# Patient Record
Sex: Male | Born: 1945
Health system: Southern US, Community
[De-identification: ages and names within clinical notes are randomized; demographics above are authoritative.]

## PROBLEM LIST (undated history)

## (undated) DIAGNOSIS — H3530 Unspecified macular degeneration: Secondary | ICD-10-CM

## (undated) DIAGNOSIS — E079 Disorder of thyroid, unspecified: Secondary | ICD-10-CM

## (undated) DIAGNOSIS — E119 Type 2 diabetes mellitus without complications: Secondary | ICD-10-CM

## (undated) DIAGNOSIS — M1990 Unspecified osteoarthritis, unspecified site: Secondary | ICD-10-CM

## (undated) DIAGNOSIS — I10 Essential (primary) hypertension: Secondary | ICD-10-CM

## (undated) DIAGNOSIS — E785 Hyperlipidemia, unspecified: Secondary | ICD-10-CM

## (undated) HISTORY — DX: Essential (primary) hypertension: I10

## (undated) HISTORY — DX: Hyperlipidemia, unspecified: E78.5

## (undated) HISTORY — DX: Disorder of thyroid, unspecified: E07.9

## (undated) HISTORY — DX: Unspecified osteoarthritis, unspecified site: M19.90

## (undated) HISTORY — DX: Type 2 diabetes mellitus without complications: E11.9

## (undated) HISTORY — DX: Unspecified macular degeneration: H35.30

---

## 1993-03-12 DIAGNOSIS — E119 Type 2 diabetes mellitus without complications: Secondary | ICD-10-CM

## 1993-03-12 HISTORY — DX: Type 2 diabetes mellitus without complications: E11.9

## 2015-03-25 DIAGNOSIS — J069 Acute upper respiratory infection, unspecified: Secondary | ICD-10-CM | POA: Diagnosis not present

## 2015-03-25 DIAGNOSIS — Z23 Encounter for immunization: Secondary | ICD-10-CM | POA: Diagnosis not present

## 2015-03-25 DIAGNOSIS — Z7984 Long term (current) use of oral hypoglycemic drugs: Secondary | ICD-10-CM | POA: Diagnosis not present

## 2015-03-25 DIAGNOSIS — E785 Hyperlipidemia, unspecified: Secondary | ICD-10-CM | POA: Diagnosis not present

## 2015-03-25 DIAGNOSIS — E119 Type 2 diabetes mellitus without complications: Secondary | ICD-10-CM | POA: Diagnosis not present

## 2015-03-31 ENCOUNTER — Ambulatory Visit: Payer: Self-pay | Admitting: Family

## 2015-05-10 DIAGNOSIS — H01005 Unspecified blepharitis left lower eyelid: Secondary | ICD-10-CM | POA: Diagnosis not present

## 2015-05-10 DIAGNOSIS — H01002 Unspecified blepharitis right lower eyelid: Secondary | ICD-10-CM | POA: Diagnosis not present

## 2015-05-10 DIAGNOSIS — H01001 Unspecified blepharitis right upper eyelid: Secondary | ICD-10-CM | POA: Diagnosis not present

## 2015-05-10 DIAGNOSIS — H01004 Unspecified blepharitis left upper eyelid: Secondary | ICD-10-CM | POA: Diagnosis not present

## 2015-05-10 DIAGNOSIS — H5213 Myopia, bilateral: Secondary | ICD-10-CM | POA: Diagnosis not present

## 2015-05-10 DIAGNOSIS — H52223 Regular astigmatism, bilateral: Secondary | ICD-10-CM | POA: Diagnosis not present

## 2015-05-10 DIAGNOSIS — H524 Presbyopia: Secondary | ICD-10-CM | POA: Diagnosis not present

## 2015-05-10 DIAGNOSIS — E119 Type 2 diabetes mellitus without complications: Secondary | ICD-10-CM | POA: Diagnosis not present

## 2015-05-10 DIAGNOSIS — H2513 Age-related nuclear cataract, bilateral: Secondary | ICD-10-CM | POA: Diagnosis not present

## 2015-10-11 DIAGNOSIS — Z7984 Long term (current) use of oral hypoglycemic drugs: Secondary | ICD-10-CM | POA: Diagnosis not present

## 2015-10-11 DIAGNOSIS — E119 Type 2 diabetes mellitus without complications: Secondary | ICD-10-CM | POA: Diagnosis not present

## 2015-10-11 DIAGNOSIS — B353 Tinea pedis: Secondary | ICD-10-CM | POA: Diagnosis not present

## 2015-10-11 DIAGNOSIS — E785 Hyperlipidemia, unspecified: Secondary | ICD-10-CM | POA: Diagnosis not present

## 2015-10-11 DIAGNOSIS — E1165 Type 2 diabetes mellitus with hyperglycemia: Secondary | ICD-10-CM | POA: Diagnosis not present

## 2015-10-11 DIAGNOSIS — Z79899 Other long term (current) drug therapy: Secondary | ICD-10-CM | POA: Diagnosis not present

## 2015-10-11 DIAGNOSIS — E7800 Pure hypercholesterolemia, unspecified: Secondary | ICD-10-CM | POA: Diagnosis not present

## 2016-01-06 DIAGNOSIS — Z7984 Long term (current) use of oral hypoglycemic drugs: Secondary | ICD-10-CM | POA: Diagnosis not present

## 2016-01-06 DIAGNOSIS — E119 Type 2 diabetes mellitus without complications: Secondary | ICD-10-CM | POA: Diagnosis not present

## 2016-01-06 DIAGNOSIS — R55 Syncope and collapse: Secondary | ICD-10-CM | POA: Diagnosis not present

## 2016-01-06 DIAGNOSIS — E7800 Pure hypercholesterolemia, unspecified: Secondary | ICD-10-CM | POA: Diagnosis not present

## 2016-01-06 DIAGNOSIS — E785 Hyperlipidemia, unspecified: Secondary | ICD-10-CM | POA: Diagnosis not present

## 2016-01-20 DIAGNOSIS — E119 Type 2 diabetes mellitus without complications: Secondary | ICD-10-CM | POA: Diagnosis not present

## 2016-01-20 DIAGNOSIS — Z0000 Encounter for general adult medical examination without abnormal findings: Secondary | ICD-10-CM | POA: Diagnosis not present

## 2016-01-20 DIAGNOSIS — E1165 Type 2 diabetes mellitus with hyperglycemia: Secondary | ICD-10-CM | POA: Diagnosis not present

## 2016-01-20 DIAGNOSIS — Z7984 Long term (current) use of oral hypoglycemic drugs: Secondary | ICD-10-CM | POA: Diagnosis not present

## 2016-01-20 DIAGNOSIS — Z79899 Other long term (current) drug therapy: Secondary | ICD-10-CM | POA: Diagnosis not present

## 2016-01-20 DIAGNOSIS — Z125 Encounter for screening for malignant neoplasm of prostate: Secondary | ICD-10-CM | POA: Diagnosis not present

## 2016-01-20 DIAGNOSIS — E7800 Pure hypercholesterolemia, unspecified: Secondary | ICD-10-CM | POA: Diagnosis not present

## 2016-01-20 DIAGNOSIS — B353 Tinea pedis: Secondary | ICD-10-CM | POA: Diagnosis not present

## 2016-01-27 DIAGNOSIS — Z1211 Encounter for screening for malignant neoplasm of colon: Secondary | ICD-10-CM | POA: Diagnosis not present

## 2016-01-27 DIAGNOSIS — Z79899 Other long term (current) drug therapy: Secondary | ICD-10-CM | POA: Diagnosis not present

## 2016-01-27 DIAGNOSIS — Z0000 Encounter for general adult medical examination without abnormal findings: Secondary | ICD-10-CM | POA: Diagnosis not present

## 2016-03-21 DIAGNOSIS — J209 Acute bronchitis, unspecified: Secondary | ICD-10-CM | POA: Diagnosis not present

## 2016-05-11 DIAGNOSIS — H52223 Regular astigmatism, bilateral: Secondary | ICD-10-CM | POA: Diagnosis not present

## 2016-05-11 DIAGNOSIS — H01001 Unspecified blepharitis right upper eyelid: Secondary | ICD-10-CM | POA: Diagnosis not present

## 2016-05-11 DIAGNOSIS — H5213 Myopia, bilateral: Secondary | ICD-10-CM | POA: Diagnosis not present

## 2016-05-11 DIAGNOSIS — H01004 Unspecified blepharitis left upper eyelid: Secondary | ICD-10-CM | POA: Diagnosis not present

## 2016-05-11 DIAGNOSIS — H524 Presbyopia: Secondary | ICD-10-CM | POA: Diagnosis not present

## 2016-05-11 DIAGNOSIS — H2513 Age-related nuclear cataract, bilateral: Secondary | ICD-10-CM | POA: Diagnosis not present

## 2016-05-11 DIAGNOSIS — E119 Type 2 diabetes mellitus without complications: Secondary | ICD-10-CM | POA: Diagnosis not present

## 2016-07-20 DIAGNOSIS — B351 Tinea unguium: Secondary | ICD-10-CM | POA: Diagnosis not present

## 2016-07-20 DIAGNOSIS — E119 Type 2 diabetes mellitus without complications: Secondary | ICD-10-CM | POA: Diagnosis not present

## 2016-07-20 DIAGNOSIS — Z79899 Other long term (current) drug therapy: Secondary | ICD-10-CM | POA: Diagnosis not present

## 2016-07-20 DIAGNOSIS — R0989 Other specified symptoms and signs involving the circulatory and respiratory systems: Secondary | ICD-10-CM | POA: Diagnosis not present

## 2016-07-20 DIAGNOSIS — E7800 Pure hypercholesterolemia, unspecified: Secondary | ICD-10-CM | POA: Diagnosis not present

## 2016-07-23 ENCOUNTER — Other Ambulatory Visit: Payer: Self-pay | Admitting: Family Medicine

## 2016-07-23 DIAGNOSIS — R0989 Other specified symptoms and signs involving the circulatory and respiratory systems: Secondary | ICD-10-CM

## 2016-08-10 ENCOUNTER — Ambulatory Visit
Admission: RE | Admit: 2016-08-10 | Discharge: 2016-08-10 | Disposition: A | Discharge status: Home / Self Care | Payer: Self-pay | Source: Ambulatory Visit | Attending: Family Medicine | Admitting: Family Medicine

## 2016-08-10 DIAGNOSIS — E119 Type 2 diabetes mellitus without complications: Secondary | ICD-10-CM | POA: Diagnosis not present

## 2016-08-10 DIAGNOSIS — R0989 Other specified symptoms and signs involving the circulatory and respiratory systems: Secondary | ICD-10-CM

## 2016-08-17 DIAGNOSIS — B351 Tinea unguium: Secondary | ICD-10-CM | POA: Diagnosis not present

## 2016-10-22 DIAGNOSIS — E119 Type 2 diabetes mellitus without complications: Secondary | ICD-10-CM | POA: Diagnosis not present

## 2016-10-29 DIAGNOSIS — B351 Tinea unguium: Secondary | ICD-10-CM | POA: Diagnosis not present

## 2016-10-29 DIAGNOSIS — E119 Type 2 diabetes mellitus without complications: Secondary | ICD-10-CM | POA: Diagnosis not present

## 2016-12-27 DIAGNOSIS — R42 Dizziness and giddiness: Secondary | ICD-10-CM | POA: Diagnosis not present

## 2016-12-27 DIAGNOSIS — R208 Other disturbances of skin sensation: Secondary | ICD-10-CM | POA: Diagnosis not present

## 2016-12-27 DIAGNOSIS — E119 Type 2 diabetes mellitus without complications: Secondary | ICD-10-CM | POA: Diagnosis not present

## 2016-12-27 DIAGNOSIS — R202 Paresthesia of skin: Secondary | ICD-10-CM | POA: Diagnosis not present

## 2017-01-03 DIAGNOSIS — Z23 Encounter for immunization: Secondary | ICD-10-CM | POA: Diagnosis not present

## 2017-02-20 DIAGNOSIS — Z0000 Encounter for general adult medical examination without abnormal findings: Secondary | ICD-10-CM | POA: Diagnosis not present

## 2017-02-20 DIAGNOSIS — E119 Type 2 diabetes mellitus without complications: Secondary | ICD-10-CM | POA: Diagnosis not present

## 2017-02-20 DIAGNOSIS — E7800 Pure hypercholesterolemia, unspecified: Secondary | ICD-10-CM | POA: Diagnosis not present

## 2017-02-20 DIAGNOSIS — Z79899 Other long term (current) drug therapy: Secondary | ICD-10-CM | POA: Diagnosis not present

## 2017-05-17 DIAGNOSIS — H524 Presbyopia: Secondary | ICD-10-CM | POA: Diagnosis not present

## 2017-05-17 DIAGNOSIS — H01001 Unspecified blepharitis right upper eyelid: Secondary | ICD-10-CM | POA: Diagnosis not present

## 2017-05-17 DIAGNOSIS — H01004 Unspecified blepharitis left upper eyelid: Secondary | ICD-10-CM | POA: Diagnosis not present

## 2017-05-17 DIAGNOSIS — E119 Type 2 diabetes mellitus without complications: Secondary | ICD-10-CM | POA: Diagnosis not present

## 2017-05-17 DIAGNOSIS — H2513 Age-related nuclear cataract, bilateral: Secondary | ICD-10-CM | POA: Diagnosis not present

## 2017-05-17 DIAGNOSIS — H5211 Myopia, right eye: Secondary | ICD-10-CM | POA: Diagnosis not present

## 2017-05-30 DIAGNOSIS — K228 Other specified diseases of esophagus: Secondary | ICD-10-CM | POA: Diagnosis not present

## 2017-05-30 DIAGNOSIS — D126 Benign neoplasm of colon, unspecified: Secondary | ICD-10-CM | POA: Diagnosis not present

## 2017-05-30 DIAGNOSIS — K269 Duodenal ulcer, unspecified as acute or chronic, without hemorrhage or perforation: Secondary | ICD-10-CM | POA: Diagnosis not present

## 2017-05-30 DIAGNOSIS — K2930 Chronic superficial gastritis without bleeding: Secondary | ICD-10-CM | POA: Diagnosis not present

## 2017-05-30 DIAGNOSIS — K219 Gastro-esophageal reflux disease without esophagitis: Secondary | ICD-10-CM | POA: Diagnosis not present

## 2017-05-30 DIAGNOSIS — Z1211 Encounter for screening for malignant neoplasm of colon: Secondary | ICD-10-CM | POA: Diagnosis not present

## 2017-05-30 DIAGNOSIS — K2270 Barrett's esophagus without dysplasia: Secondary | ICD-10-CM | POA: Diagnosis not present

## 2017-05-30 DIAGNOSIS — K635 Polyp of colon: Secondary | ICD-10-CM | POA: Diagnosis not present

## 2017-06-06 DIAGNOSIS — K2930 Chronic superficial gastritis without bleeding: Secondary | ICD-10-CM | POA: Diagnosis not present

## 2017-06-06 DIAGNOSIS — D126 Benign neoplasm of colon, unspecified: Secondary | ICD-10-CM | POA: Diagnosis not present

## 2017-06-06 DIAGNOSIS — K219 Gastro-esophageal reflux disease without esophagitis: Secondary | ICD-10-CM | POA: Diagnosis not present

## 2017-06-06 DIAGNOSIS — Z1211 Encounter for screening for malignant neoplasm of colon: Secondary | ICD-10-CM | POA: Diagnosis not present

## 2017-08-26 DIAGNOSIS — E119 Type 2 diabetes mellitus without complications: Secondary | ICD-10-CM | POA: Diagnosis not present

## 2017-08-26 DIAGNOSIS — E7800 Pure hypercholesterolemia, unspecified: Secondary | ICD-10-CM | POA: Diagnosis not present

## 2017-08-26 DIAGNOSIS — E1165 Type 2 diabetes mellitus with hyperglycemia: Secondary | ICD-10-CM | POA: Diagnosis not present

## 2017-08-26 DIAGNOSIS — Z79899 Other long term (current) drug therapy: Secondary | ICD-10-CM | POA: Diagnosis not present

## 2017-12-10 DIAGNOSIS — E7800 Pure hypercholesterolemia, unspecified: Secondary | ICD-10-CM | POA: Diagnosis not present

## 2017-12-10 DIAGNOSIS — Z79899 Other long term (current) drug therapy: Secondary | ICD-10-CM | POA: Diagnosis not present

## 2017-12-10 DIAGNOSIS — Z23 Encounter for immunization: Secondary | ICD-10-CM | POA: Diagnosis not present

## 2017-12-10 DIAGNOSIS — E119 Type 2 diabetes mellitus without complications: Secondary | ICD-10-CM | POA: Diagnosis not present

## 2018-05-05 DIAGNOSIS — Z79899 Other long term (current) drug therapy: Secondary | ICD-10-CM | POA: Diagnosis not present

## 2018-05-05 DIAGNOSIS — E119 Type 2 diabetes mellitus without complications: Secondary | ICD-10-CM | POA: Diagnosis not present

## 2018-05-05 DIAGNOSIS — E559 Vitamin D deficiency, unspecified: Secondary | ICD-10-CM | POA: Diagnosis not present

## 2018-05-05 DIAGNOSIS — E7800 Pure hypercholesterolemia, unspecified: Secondary | ICD-10-CM | POA: Diagnosis not present

## 2018-05-05 DIAGNOSIS — I10 Essential (primary) hypertension: Secondary | ICD-10-CM | POA: Diagnosis not present

## 2018-05-05 DIAGNOSIS — R202 Paresthesia of skin: Secondary | ICD-10-CM | POA: Diagnosis not present

## 2018-05-05 DIAGNOSIS — Z7984 Long term (current) use of oral hypoglycemic drugs: Secondary | ICD-10-CM | POA: Diagnosis not present

## 2018-05-13 DIAGNOSIS — H01001 Unspecified blepharitis right upper eyelid: Secondary | ICD-10-CM | POA: Diagnosis not present

## 2018-05-13 DIAGNOSIS — H01004 Unspecified blepharitis left upper eyelid: Secondary | ICD-10-CM | POA: Diagnosis not present

## 2018-05-13 DIAGNOSIS — H34832 Tributary (branch) retinal vein occlusion, left eye, with macular edema: Secondary | ICD-10-CM | POA: Diagnosis not present

## 2018-05-13 DIAGNOSIS — H2513 Age-related nuclear cataract, bilateral: Secondary | ICD-10-CM | POA: Diagnosis not present

## 2018-05-13 DIAGNOSIS — E119 Type 2 diabetes mellitus without complications: Secondary | ICD-10-CM | POA: Diagnosis not present

## 2018-05-19 DIAGNOSIS — H53132 Sudden visual loss, left eye: Secondary | ICD-10-CM | POA: Diagnosis not present

## 2018-05-19 DIAGNOSIS — H3562 Retinal hemorrhage, left eye: Secondary | ICD-10-CM | POA: Diagnosis not present

## 2018-05-19 DIAGNOSIS — H34832 Tributary (branch) retinal vein occlusion, left eye, with macular edema: Secondary | ICD-10-CM | POA: Diagnosis not present

## 2018-05-19 DIAGNOSIS — H35033 Hypertensive retinopathy, bilateral: Secondary | ICD-10-CM | POA: Diagnosis not present

## 2018-05-19 DIAGNOSIS — H25813 Combined forms of age-related cataract, bilateral: Secondary | ICD-10-CM | POA: Diagnosis not present

## 2018-05-22 DIAGNOSIS — H34832 Tributary (branch) retinal vein occlusion, left eye, with macular edema: Secondary | ICD-10-CM | POA: Diagnosis not present

## 2018-05-26 DIAGNOSIS — Z79899 Other long term (current) drug therapy: Secondary | ICD-10-CM | POA: Diagnosis not present

## 2018-05-26 DIAGNOSIS — E7800 Pure hypercholesterolemia, unspecified: Secondary | ICD-10-CM | POA: Diagnosis not present

## 2018-05-26 DIAGNOSIS — E119 Type 2 diabetes mellitus without complications: Secondary | ICD-10-CM | POA: Diagnosis not present

## 2018-05-26 DIAGNOSIS — I10 Essential (primary) hypertension: Secondary | ICD-10-CM | POA: Diagnosis not present

## 2018-07-04 DIAGNOSIS — H34832 Tributary (branch) retinal vein occlusion, left eye, with macular edema: Secondary | ICD-10-CM | POA: Diagnosis not present

## 2018-08-06 DIAGNOSIS — E119 Type 2 diabetes mellitus without complications: Secondary | ICD-10-CM | POA: Diagnosis not present

## 2018-08-06 DIAGNOSIS — Z79899 Other long term (current) drug therapy: Secondary | ICD-10-CM | POA: Diagnosis not present

## 2018-08-07 DIAGNOSIS — H34832 Tributary (branch) retinal vein occlusion, left eye, with macular edema: Secondary | ICD-10-CM | POA: Diagnosis not present

## 2018-08-08 DIAGNOSIS — E119 Type 2 diabetes mellitus without complications: Secondary | ICD-10-CM | POA: Diagnosis not present

## 2018-08-08 DIAGNOSIS — Z7984 Long term (current) use of oral hypoglycemic drugs: Secondary | ICD-10-CM | POA: Diagnosis not present

## 2018-08-08 DIAGNOSIS — E7800 Pure hypercholesterolemia, unspecified: Secondary | ICD-10-CM | POA: Diagnosis not present

## 2018-08-08 DIAGNOSIS — I10 Essential (primary) hypertension: Secondary | ICD-10-CM | POA: Diagnosis not present

## 2018-09-15 DIAGNOSIS — H34832 Tributary (branch) retinal vein occlusion, left eye, with macular edema: Secondary | ICD-10-CM | POA: Diagnosis not present

## 2018-10-22 DIAGNOSIS — H34832 Tributary (branch) retinal vein occlusion, left eye, with macular edema: Secondary | ICD-10-CM | POA: Diagnosis not present

## 2018-11-18 DIAGNOSIS — Z79899 Other long term (current) drug therapy: Secondary | ICD-10-CM | POA: Diagnosis not present

## 2018-11-18 DIAGNOSIS — E559 Vitamin D deficiency, unspecified: Secondary | ICD-10-CM | POA: Diagnosis not present

## 2018-11-18 DIAGNOSIS — I10 Essential (primary) hypertension: Secondary | ICD-10-CM | POA: Diagnosis not present

## 2018-11-18 DIAGNOSIS — E7800 Pure hypercholesterolemia, unspecified: Secondary | ICD-10-CM | POA: Diagnosis not present

## 2018-11-18 DIAGNOSIS — Z7984 Long term (current) use of oral hypoglycemic drugs: Secondary | ICD-10-CM | POA: Diagnosis not present

## 2018-11-18 DIAGNOSIS — R0989 Other specified symptoms and signs involving the circulatory and respiratory systems: Secondary | ICD-10-CM | POA: Diagnosis not present

## 2018-11-18 DIAGNOSIS — Z1211 Encounter for screening for malignant neoplasm of colon: Secondary | ICD-10-CM | POA: Diagnosis not present

## 2018-11-18 DIAGNOSIS — E119 Type 2 diabetes mellitus without complications: Secondary | ICD-10-CM | POA: Diagnosis not present

## 2018-11-21 ENCOUNTER — Other Ambulatory Visit: Payer: Self-pay | Admitting: Family Medicine

## 2018-11-21 DIAGNOSIS — R0989 Other specified symptoms and signs involving the circulatory and respiratory systems: Secondary | ICD-10-CM

## 2018-11-28 ENCOUNTER — Ambulatory Visit
Admission: RE | Admit: 2018-11-28 | Discharge: 2018-11-28 | Disposition: A | Discharge status: Home / Self Care | Payer: Medicare HMO | Source: Ambulatory Visit | Attending: Family Medicine | Admitting: Family Medicine

## 2018-11-28 DIAGNOSIS — R0989 Other specified symptoms and signs involving the circulatory and respiratory systems: Secondary | ICD-10-CM | POA: Diagnosis not present

## 2018-12-03 DIAGNOSIS — Z1211 Encounter for screening for malignant neoplasm of colon: Secondary | ICD-10-CM | POA: Diagnosis not present

## 2018-12-03 DIAGNOSIS — H34832 Tributary (branch) retinal vein occlusion, left eye, with macular edema: Secondary | ICD-10-CM | POA: Diagnosis not present

## 2019-01-07 DIAGNOSIS — H34832 Tributary (branch) retinal vein occlusion, left eye, with macular edema: Secondary | ICD-10-CM | POA: Diagnosis not present

## 2019-02-16 DIAGNOSIS — H34832 Tributary (branch) retinal vein occlusion, left eye, with macular edema: Secondary | ICD-10-CM | POA: Diagnosis not present

## 2019-03-13 DIAGNOSIS — E079 Disorder of thyroid, unspecified: Secondary | ICD-10-CM

## 2019-03-13 HISTORY — DX: Disorder of thyroid, unspecified: E07.9

## 2019-03-25 DIAGNOSIS — H34832 Tributary (branch) retinal vein occlusion, left eye, with macular edema: Secondary | ICD-10-CM | POA: Diagnosis not present

## 2019-04-28 ENCOUNTER — Telehealth: Payer: Self-pay

## 2019-04-28 NOTE — Telephone Encounter (Signed)
Pt called to ask to take both him and his wife off covid vaccine waiting list.

## 2019-05-06 DIAGNOSIS — H34832 Tributary (branch) retinal vein occlusion, left eye, with macular edema: Secondary | ICD-10-CM | POA: Diagnosis not present

## 2019-05-19 DIAGNOSIS — H2513 Age-related nuclear cataract, bilateral: Secondary | ICD-10-CM | POA: Diagnosis not present

## 2019-05-19 DIAGNOSIS — E119 Type 2 diabetes mellitus without complications: Secondary | ICD-10-CM | POA: Diagnosis not present

## 2019-05-19 DIAGNOSIS — H34832 Tributary (branch) retinal vein occlusion, left eye, with macular edema: Secondary | ICD-10-CM | POA: Diagnosis not present

## 2019-05-19 DIAGNOSIS — H0100A Unspecified blepharitis right eye, upper and lower eyelids: Secondary | ICD-10-CM | POA: Diagnosis not present

## 2019-05-29 DIAGNOSIS — D649 Anemia, unspecified: Secondary | ICD-10-CM | POA: Diagnosis not present

## 2019-05-29 DIAGNOSIS — E559 Vitamin D deficiency, unspecified: Secondary | ICD-10-CM | POA: Diagnosis not present

## 2019-05-29 DIAGNOSIS — Z79899 Other long term (current) drug therapy: Secondary | ICD-10-CM | POA: Diagnosis not present

## 2019-05-29 DIAGNOSIS — E7800 Pure hypercholesterolemia, unspecified: Secondary | ICD-10-CM | POA: Diagnosis not present

## 2019-05-29 DIAGNOSIS — E119 Type 2 diabetes mellitus without complications: Secondary | ICD-10-CM | POA: Diagnosis not present

## 2019-06-01 DIAGNOSIS — I10 Essential (primary) hypertension: Secondary | ICD-10-CM | POA: Diagnosis not present

## 2019-06-01 DIAGNOSIS — R946 Abnormal results of thyroid function studies: Secondary | ICD-10-CM | POA: Diagnosis not present

## 2019-06-01 DIAGNOSIS — E7800 Pure hypercholesterolemia, unspecified: Secondary | ICD-10-CM | POA: Diagnosis not present

## 2019-06-01 DIAGNOSIS — E119 Type 2 diabetes mellitus without complications: Secondary | ICD-10-CM | POA: Diagnosis not present

## 2019-06-01 DIAGNOSIS — D649 Anemia, unspecified: Secondary | ICD-10-CM | POA: Diagnosis not present

## 2019-06-01 DIAGNOSIS — E039 Hypothyroidism, unspecified: Secondary | ICD-10-CM | POA: Diagnosis not present

## 2019-06-10 DIAGNOSIS — H3562 Retinal hemorrhage, left eye: Secondary | ICD-10-CM | POA: Diagnosis not present

## 2019-06-10 DIAGNOSIS — H25813 Combined forms of age-related cataract, bilateral: Secondary | ICD-10-CM | POA: Diagnosis not present

## 2019-06-10 DIAGNOSIS — H34832 Tributary (branch) retinal vein occlusion, left eye, with macular edema: Secondary | ICD-10-CM | POA: Diagnosis not present

## 2019-06-10 DIAGNOSIS — H35033 Hypertensive retinopathy, bilateral: Secondary | ICD-10-CM | POA: Diagnosis not present

## 2019-07-09 DIAGNOSIS — H34832 Tributary (branch) retinal vein occlusion, left eye, with macular edema: Secondary | ICD-10-CM | POA: Diagnosis not present

## 2019-07-27 DIAGNOSIS — D649 Anemia, unspecified: Secondary | ICD-10-CM | POA: Diagnosis not present

## 2019-07-27 DIAGNOSIS — Z130 Encounter for screening for diseases of the blood and blood-forming organs and certain disorders involving the immune mechanism: Secondary | ICD-10-CM | POA: Diagnosis not present

## 2019-07-27 DIAGNOSIS — R946 Abnormal results of thyroid function studies: Secondary | ICD-10-CM | POA: Diagnosis not present

## 2019-09-09 DIAGNOSIS — H34832 Tributary (branch) retinal vein occlusion, left eye, with macular edema: Secondary | ICD-10-CM | POA: Diagnosis not present

## 2019-10-06 DIAGNOSIS — E039 Hypothyroidism, unspecified: Secondary | ICD-10-CM | POA: Diagnosis not present

## 2019-12-02 DIAGNOSIS — H34832 Tributary (branch) retinal vein occlusion, left eye, with macular edema: Secondary | ICD-10-CM | POA: Diagnosis not present

## 2019-12-08 DIAGNOSIS — E1169 Type 2 diabetes mellitus with other specified complication: Secondary | ICD-10-CM | POA: Diagnosis not present

## 2019-12-08 DIAGNOSIS — Z0001 Encounter for general adult medical examination with abnormal findings: Secondary | ICD-10-CM | POA: Diagnosis not present

## 2019-12-08 DIAGNOSIS — E7800 Pure hypercholesterolemia, unspecified: Secondary | ICD-10-CM | POA: Diagnosis not present

## 2019-12-08 DIAGNOSIS — Z125 Encounter for screening for malignant neoplasm of prostate: Secondary | ICD-10-CM | POA: Diagnosis not present

## 2019-12-08 DIAGNOSIS — D649 Anemia, unspecified: Secondary | ICD-10-CM | POA: Diagnosis not present

## 2019-12-08 DIAGNOSIS — E039 Hypothyroidism, unspecified: Secondary | ICD-10-CM | POA: Diagnosis not present

## 2019-12-08 DIAGNOSIS — E559 Vitamin D deficiency, unspecified: Secondary | ICD-10-CM | POA: Diagnosis not present

## 2019-12-08 DIAGNOSIS — Z79899 Other long term (current) drug therapy: Secondary | ICD-10-CM | POA: Diagnosis not present

## 2019-12-08 DIAGNOSIS — I10 Essential (primary) hypertension: Secondary | ICD-10-CM | POA: Diagnosis not present

## 2020-03-07 DIAGNOSIS — Z20822 Contact with and (suspected) exposure to covid-19: Secondary | ICD-10-CM | POA: Diagnosis not present

## 2020-03-08 DIAGNOSIS — H34832 Tributary (branch) retinal vein occlusion, left eye, with macular edema: Secondary | ICD-10-CM | POA: Diagnosis not present

## 2020-03-16 DIAGNOSIS — E039 Hypothyroidism, unspecified: Secondary | ICD-10-CM | POA: Diagnosis not present

## 2020-04-26 ENCOUNTER — Other Ambulatory Visit (HOSPITAL_COMMUNITY): Payer: Self-pay | Admitting: Family Medicine

## 2020-04-26 DIAGNOSIS — M79604 Pain in right leg: Secondary | ICD-10-CM | POA: Diagnosis not present

## 2020-04-26 DIAGNOSIS — R600 Localized edema: Secondary | ICD-10-CM

## 2020-04-27 ENCOUNTER — Other Ambulatory Visit: Payer: Self-pay

## 2020-04-27 ENCOUNTER — Ambulatory Visit (HOSPITAL_COMMUNITY)
Admission: RE | Admit: 2020-04-27 | Discharge: 2020-04-27 | Disposition: A | Discharge status: Home / Self Care | Payer: Medicare HMO | Source: Ambulatory Visit | Attending: Family Medicine | Admitting: Family Medicine

## 2020-04-27 DIAGNOSIS — R600 Localized edema: Secondary | ICD-10-CM | POA: Insufficient documentation

## 2020-05-25 DIAGNOSIS — H34832 Tributary (branch) retinal vein occlusion, left eye, with macular edema: Secondary | ICD-10-CM | POA: Diagnosis not present

## 2020-06-08 DIAGNOSIS — E1169 Type 2 diabetes mellitus with other specified complication: Secondary | ICD-10-CM | POA: Diagnosis not present

## 2020-06-08 DIAGNOSIS — Z79899 Other long term (current) drug therapy: Secondary | ICD-10-CM | POA: Diagnosis not present

## 2020-06-08 DIAGNOSIS — E039 Hypothyroidism, unspecified: Secondary | ICD-10-CM | POA: Diagnosis not present

## 2020-06-08 DIAGNOSIS — E7800 Pure hypercholesterolemia, unspecified: Secondary | ICD-10-CM | POA: Diagnosis not present

## 2020-06-14 DIAGNOSIS — E119 Type 2 diabetes mellitus without complications: Secondary | ICD-10-CM | POA: Diagnosis not present

## 2020-06-14 DIAGNOSIS — H34832 Tributary (branch) retinal vein occlusion, left eye, with macular edema: Secondary | ICD-10-CM | POA: Diagnosis not present

## 2020-06-14 DIAGNOSIS — H0100A Unspecified blepharitis right eye, upper and lower eyelids: Secondary | ICD-10-CM | POA: Diagnosis not present

## 2020-06-14 DIAGNOSIS — H2513 Age-related nuclear cataract, bilateral: Secondary | ICD-10-CM | POA: Diagnosis not present

## 2020-08-10 DIAGNOSIS — H34832 Tributary (branch) retinal vein occlusion, left eye, with macular edema: Secondary | ICD-10-CM | POA: Diagnosis not present

## 2020-08-15 DIAGNOSIS — E039 Hypothyroidism, unspecified: Secondary | ICD-10-CM | POA: Diagnosis not present

## 2020-09-05 DIAGNOSIS — E039 Hypothyroidism, unspecified: Secondary | ICD-10-CM | POA: Diagnosis not present

## 2020-11-02 DIAGNOSIS — H34832 Tributary (branch) retinal vein occlusion, left eye, with macular edema: Secondary | ICD-10-CM | POA: Diagnosis not present

## 2020-11-07 DIAGNOSIS — E039 Hypothyroidism, unspecified: Secondary | ICD-10-CM | POA: Diagnosis not present

## 2020-12-08 DIAGNOSIS — E039 Hypothyroidism, unspecified: Secondary | ICD-10-CM | POA: Diagnosis not present

## 2020-12-27 DIAGNOSIS — E039 Hypothyroidism, unspecified: Secondary | ICD-10-CM | POA: Diagnosis not present

## 2020-12-27 DIAGNOSIS — Z79899 Other long term (current) drug therapy: Secondary | ICD-10-CM | POA: Diagnosis not present

## 2020-12-27 DIAGNOSIS — Z0001 Encounter for general adult medical examination with abnormal findings: Secondary | ICD-10-CM | POA: Diagnosis not present

## 2020-12-27 DIAGNOSIS — E1169 Type 2 diabetes mellitus with other specified complication: Secondary | ICD-10-CM | POA: Diagnosis not present

## 2020-12-27 DIAGNOSIS — I10 Essential (primary) hypertension: Secondary | ICD-10-CM | POA: Diagnosis not present

## 2020-12-27 DIAGNOSIS — Z1211 Encounter for screening for malignant neoplasm of colon: Secondary | ICD-10-CM | POA: Diagnosis not present

## 2020-12-27 DIAGNOSIS — E559 Vitamin D deficiency, unspecified: Secondary | ICD-10-CM | POA: Diagnosis not present

## 2020-12-27 DIAGNOSIS — E7800 Pure hypercholesterolemia, unspecified: Secondary | ICD-10-CM | POA: Diagnosis not present

## 2021-02-06 DIAGNOSIS — H34832 Tributary (branch) retinal vein occlusion, left eye, with macular edema: Secondary | ICD-10-CM | POA: Diagnosis not present

## 2021-02-20 DIAGNOSIS — E039 Hypothyroidism, unspecified: Secondary | ICD-10-CM | POA: Diagnosis not present

## 2021-02-24 ENCOUNTER — Other Ambulatory Visit: Payer: Self-pay | Admitting: Family Medicine

## 2021-02-24 ENCOUNTER — Ambulatory Visit
Admission: RE | Admit: 2021-02-24 | Discharge: 2021-02-24 | Disposition: A | Discharge status: Home / Self Care | Payer: Medicare HMO | Source: Ambulatory Visit | Attending: Family Medicine | Admitting: Family Medicine

## 2021-02-24 DIAGNOSIS — M25561 Pain in right knee: Secondary | ICD-10-CM

## 2021-02-24 DIAGNOSIS — M25562 Pain in left knee: Secondary | ICD-10-CM

## 2021-02-24 DIAGNOSIS — M79605 Pain in left leg: Secondary | ICD-10-CM | POA: Diagnosis not present

## 2021-02-24 DIAGNOSIS — M1712 Unilateral primary osteoarthritis, left knee: Secondary | ICD-10-CM | POA: Diagnosis not present

## 2021-02-24 DIAGNOSIS — M1711 Unilateral primary osteoarthritis, right knee: Secondary | ICD-10-CM | POA: Diagnosis not present

## 2021-05-02 DIAGNOSIS — M25562 Pain in left knee: Secondary | ICD-10-CM | POA: Diagnosis not present

## 2021-05-02 DIAGNOSIS — M25561 Pain in right knee: Secondary | ICD-10-CM | POA: Diagnosis not present

## 2021-05-17 DIAGNOSIS — H34832 Tributary (branch) retinal vein occlusion, left eye, with macular edema: Secondary | ICD-10-CM | POA: Diagnosis not present

## 2021-06-21 DIAGNOSIS — E119 Type 2 diabetes mellitus without complications: Secondary | ICD-10-CM | POA: Diagnosis not present

## 2021-06-21 DIAGNOSIS — H34832 Tributary (branch) retinal vein occlusion, left eye, with macular edema: Secondary | ICD-10-CM | POA: Diagnosis not present

## 2021-06-21 DIAGNOSIS — H2513 Age-related nuclear cataract, bilateral: Secondary | ICD-10-CM | POA: Diagnosis not present

## 2021-06-21 DIAGNOSIS — H0102A Squamous blepharitis right eye, upper and lower eyelids: Secondary | ICD-10-CM | POA: Diagnosis not present

## 2021-07-13 DIAGNOSIS — E1169 Type 2 diabetes mellitus with other specified complication: Secondary | ICD-10-CM | POA: Diagnosis not present

## 2021-07-13 DIAGNOSIS — E785 Hyperlipidemia, unspecified: Secondary | ICD-10-CM | POA: Diagnosis not present

## 2021-07-13 DIAGNOSIS — E039 Hypothyroidism, unspecified: Secondary | ICD-10-CM | POA: Diagnosis not present

## 2021-07-13 DIAGNOSIS — I10 Essential (primary) hypertension: Secondary | ICD-10-CM | POA: Diagnosis not present

## 2021-07-13 DIAGNOSIS — Z79899 Other long term (current) drug therapy: Secondary | ICD-10-CM | POA: Diagnosis not present

## 2021-07-13 DIAGNOSIS — E7800 Pure hypercholesterolemia, unspecified: Secondary | ICD-10-CM | POA: Diagnosis not present

## 2021-08-16 DIAGNOSIS — H34832 Tributary (branch) retinal vein occlusion, left eye, with macular edema: Secondary | ICD-10-CM | POA: Diagnosis not present

## 2021-08-17 DIAGNOSIS — Z79899 Other long term (current) drug therapy: Secondary | ICD-10-CM | POA: Diagnosis not present

## 2021-08-18 DIAGNOSIS — Z79899 Other long term (current) drug therapy: Secondary | ICD-10-CM | POA: Diagnosis not present

## 2021-09-01 DIAGNOSIS — E119 Type 2 diabetes mellitus without complications: Secondary | ICD-10-CM | POA: Diagnosis not present

## 2021-09-21 DIAGNOSIS — M79605 Pain in left leg: Secondary | ICD-10-CM | POA: Diagnosis not present

## 2021-09-21 DIAGNOSIS — M79674 Pain in right toe(s): Secondary | ICD-10-CM | POA: Diagnosis not present

## 2021-11-15 DIAGNOSIS — H34832 Tributary (branch) retinal vein occlusion, left eye, with macular edema: Secondary | ICD-10-CM | POA: Diagnosis not present

## 2022-01-03 DIAGNOSIS — H3582 Retinal ischemia: Secondary | ICD-10-CM | POA: Diagnosis not present

## 2022-01-03 DIAGNOSIS — H25813 Combined forms of age-related cataract, bilateral: Secondary | ICD-10-CM | POA: Diagnosis not present

## 2022-01-03 DIAGNOSIS — H34832 Tributary (branch) retinal vein occlusion, left eye, with macular edema: Secondary | ICD-10-CM | POA: Diagnosis not present

## 2022-01-03 DIAGNOSIS — H3562 Retinal hemorrhage, left eye: Secondary | ICD-10-CM | POA: Diagnosis not present

## 2022-01-03 DIAGNOSIS — H35033 Hypertensive retinopathy, bilateral: Secondary | ICD-10-CM | POA: Diagnosis not present

## 2022-02-06 DIAGNOSIS — E7800 Pure hypercholesterolemia, unspecified: Secondary | ICD-10-CM | POA: Diagnosis not present

## 2022-02-06 DIAGNOSIS — R252 Cramp and spasm: Secondary | ICD-10-CM | POA: Diagnosis not present

## 2022-02-06 DIAGNOSIS — Z1211 Encounter for screening for malignant neoplasm of colon: Secondary | ICD-10-CM | POA: Diagnosis not present

## 2022-02-06 DIAGNOSIS — Z79899 Other long term (current) drug therapy: Secondary | ICD-10-CM | POA: Diagnosis not present

## 2022-02-06 DIAGNOSIS — E039 Hypothyroidism, unspecified: Secondary | ICD-10-CM | POA: Diagnosis not present

## 2022-02-06 DIAGNOSIS — E1169 Type 2 diabetes mellitus with other specified complication: Secondary | ICD-10-CM | POA: Diagnosis not present

## 2022-02-06 DIAGNOSIS — M48062 Spinal stenosis, lumbar region with neurogenic claudication: Secondary | ICD-10-CM | POA: Diagnosis not present

## 2022-02-06 DIAGNOSIS — Z0001 Encounter for general adult medical examination with abnormal findings: Secondary | ICD-10-CM | POA: Diagnosis not present

## 2022-02-07 ENCOUNTER — Other Ambulatory Visit: Payer: Self-pay | Admitting: Family Medicine

## 2022-02-07 DIAGNOSIS — R252 Cramp and spasm: Secondary | ICD-10-CM

## 2022-02-07 DIAGNOSIS — M48062 Spinal stenosis, lumbar region with neurogenic claudication: Secondary | ICD-10-CM

## 2022-02-07 DIAGNOSIS — M5432 Sciatica, left side: Secondary | ICD-10-CM

## 2022-02-14 DIAGNOSIS — H34832 Tributary (branch) retinal vein occlusion, left eye, with macular edema: Secondary | ICD-10-CM | POA: Diagnosis not present

## 2022-02-28 DIAGNOSIS — H43812 Vitreous degeneration, left eye: Secondary | ICD-10-CM | POA: Diagnosis not present

## 2022-02-28 DIAGNOSIS — H34832 Tributary (branch) retinal vein occlusion, left eye, with macular edema: Secondary | ICD-10-CM | POA: Diagnosis not present

## 2022-02-28 DIAGNOSIS — H35033 Hypertensive retinopathy, bilateral: Secondary | ICD-10-CM | POA: Diagnosis not present

## 2022-02-28 DIAGNOSIS — H35432 Paving stone degeneration of retina, left eye: Secondary | ICD-10-CM | POA: Diagnosis not present

## 2022-02-28 DIAGNOSIS — H43392 Other vitreous opacities, left eye: Secondary | ICD-10-CM | POA: Diagnosis not present

## 2022-02-28 DIAGNOSIS — H25813 Combined forms of age-related cataract, bilateral: Secondary | ICD-10-CM | POA: Diagnosis not present

## 2022-03-09 ENCOUNTER — Ambulatory Visit
Admission: RE | Admit: 2022-03-09 | Discharge: 2022-03-09 | Disposition: A | Discharge status: Home / Self Care | Payer: Medicare HMO | Source: Ambulatory Visit | Attending: Family Medicine | Admitting: Family Medicine

## 2022-03-09 DIAGNOSIS — M48061 Spinal stenosis, lumbar region without neurogenic claudication: Secondary | ICD-10-CM | POA: Diagnosis not present

## 2022-03-09 DIAGNOSIS — M5432 Sciatica, left side: Secondary | ICD-10-CM

## 2022-03-09 DIAGNOSIS — M545 Low back pain, unspecified: Secondary | ICD-10-CM | POA: Diagnosis not present

## 2022-03-09 DIAGNOSIS — R252 Cramp and spasm: Secondary | ICD-10-CM

## 2022-03-09 DIAGNOSIS — M48062 Spinal stenosis, lumbar region with neurogenic claudication: Secondary | ICD-10-CM

## 2022-03-23 DIAGNOSIS — S61012A Laceration without foreign body of left thumb without damage to nail, initial encounter: Secondary | ICD-10-CM | POA: Diagnosis not present

## 2022-03-29 DIAGNOSIS — M5416 Radiculopathy, lumbar region: Secondary | ICD-10-CM | POA: Diagnosis not present

## 2022-03-30 DIAGNOSIS — L089 Local infection of the skin and subcutaneous tissue, unspecified: Secondary | ICD-10-CM | POA: Diagnosis not present

## 2022-04-03 DIAGNOSIS — Z4802 Encounter for removal of sutures: Secondary | ICD-10-CM | POA: Diagnosis not present

## 2022-04-09 DIAGNOSIS — L239 Allergic contact dermatitis, unspecified cause: Secondary | ICD-10-CM | POA: Diagnosis not present

## 2022-04-09 DIAGNOSIS — L509 Urticaria, unspecified: Secondary | ICD-10-CM | POA: Diagnosis not present

## 2022-04-09 DIAGNOSIS — R21 Rash and other nonspecific skin eruption: Secondary | ICD-10-CM | POA: Diagnosis not present

## 2022-04-09 DIAGNOSIS — H348322 Tributary (branch) retinal vein occlusion, left eye, stable: Secondary | ICD-10-CM | POA: Diagnosis not present

## 2022-04-18 DIAGNOSIS — H25813 Combined forms of age-related cataract, bilateral: Secondary | ICD-10-CM | POA: Diagnosis not present

## 2022-04-18 DIAGNOSIS — H43812 Vitreous degeneration, left eye: Secondary | ICD-10-CM | POA: Diagnosis not present

## 2022-04-18 DIAGNOSIS — H43392 Other vitreous opacities, left eye: Secondary | ICD-10-CM | POA: Diagnosis not present

## 2022-04-18 DIAGNOSIS — H3582 Retinal ischemia: Secondary | ICD-10-CM | POA: Diagnosis not present

## 2022-04-18 DIAGNOSIS — H34832 Tributary (branch) retinal vein occlusion, left eye, with macular edema: Secondary | ICD-10-CM | POA: Diagnosis not present

## 2022-04-18 DIAGNOSIS — H35432 Paving stone degeneration of retina, left eye: Secondary | ICD-10-CM | POA: Diagnosis not present

## 2022-04-19 DIAGNOSIS — M5416 Radiculopathy, lumbar region: Secondary | ICD-10-CM | POA: Diagnosis not present

## 2022-05-02 DIAGNOSIS — H43392 Other vitreous opacities, left eye: Secondary | ICD-10-CM | POA: Diagnosis not present

## 2022-05-02 DIAGNOSIS — H3582 Retinal ischemia: Secondary | ICD-10-CM | POA: Diagnosis not present

## 2022-05-02 DIAGNOSIS — H25812 Combined forms of age-related cataract, left eye: Secondary | ICD-10-CM | POA: Diagnosis not present

## 2022-05-02 DIAGNOSIS — H43812 Vitreous degeneration, left eye: Secondary | ICD-10-CM | POA: Diagnosis not present

## 2022-05-02 DIAGNOSIS — H34832 Tributary (branch) retinal vein occlusion, left eye, with macular edema: Secondary | ICD-10-CM | POA: Diagnosis not present

## 2022-05-15 DIAGNOSIS — Z713 Dietary counseling and surveillance: Secondary | ICD-10-CM | POA: Diagnosis not present

## 2022-05-15 DIAGNOSIS — E1169 Type 2 diabetes mellitus with other specified complication: Secondary | ICD-10-CM | POA: Diagnosis not present

## 2022-06-05 DIAGNOSIS — E119 Type 2 diabetes mellitus without complications: Secondary | ICD-10-CM | POA: Diagnosis not present

## 2022-06-05 DIAGNOSIS — E559 Vitamin D deficiency, unspecified: Secondary | ICD-10-CM | POA: Diagnosis not present

## 2022-06-05 DIAGNOSIS — E039 Hypothyroidism, unspecified: Secondary | ICD-10-CM | POA: Diagnosis not present

## 2022-06-05 DIAGNOSIS — E1169 Type 2 diabetes mellitus with other specified complication: Secondary | ICD-10-CM | POA: Diagnosis not present

## 2022-06-05 DIAGNOSIS — E7800 Pure hypercholesterolemia, unspecified: Secondary | ICD-10-CM | POA: Diagnosis not present

## 2022-06-05 DIAGNOSIS — Z79899 Other long term (current) drug therapy: Secondary | ICD-10-CM | POA: Diagnosis not present

## 2022-06-18 DIAGNOSIS — H34832 Tributary (branch) retinal vein occlusion, left eye, with macular edema: Secondary | ICD-10-CM | POA: Diagnosis not present

## 2022-06-18 DIAGNOSIS — H3582 Retinal ischemia: Secondary | ICD-10-CM | POA: Diagnosis not present

## 2022-06-18 DIAGNOSIS — H3562 Retinal hemorrhage, left eye: Secondary | ICD-10-CM | POA: Diagnosis not present

## 2022-06-18 DIAGNOSIS — H25813 Combined forms of age-related cataract, bilateral: Secondary | ICD-10-CM | POA: Diagnosis not present

## 2022-06-18 DIAGNOSIS — H35033 Hypertensive retinopathy, bilateral: Secondary | ICD-10-CM | POA: Diagnosis not present

## 2022-06-18 DIAGNOSIS — H53142 Visual discomfort, left eye: Secondary | ICD-10-CM | POA: Diagnosis not present

## 2022-06-26 DIAGNOSIS — H34832 Tributary (branch) retinal vein occlusion, left eye, with macular edema: Secondary | ICD-10-CM | POA: Diagnosis not present

## 2022-06-27 DIAGNOSIS — E119 Type 2 diabetes mellitus without complications: Secondary | ICD-10-CM | POA: Diagnosis not present

## 2022-06-27 DIAGNOSIS — H2513 Age-related nuclear cataract, bilateral: Secondary | ICD-10-CM | POA: Diagnosis not present

## 2022-06-27 DIAGNOSIS — H0102A Squamous blepharitis right eye, upper and lower eyelids: Secondary | ICD-10-CM | POA: Diagnosis not present

## 2022-06-27 DIAGNOSIS — H34832 Tributary (branch) retinal vein occlusion, left eye, with macular edema: Secondary | ICD-10-CM | POA: Diagnosis not present

## 2022-07-26 DIAGNOSIS — M5416 Radiculopathy, lumbar region: Secondary | ICD-10-CM | POA: Diagnosis not present

## 2022-07-26 DIAGNOSIS — M5451 Vertebrogenic low back pain: Secondary | ICD-10-CM | POA: Diagnosis not present

## 2022-08-01 DIAGNOSIS — M5451 Vertebrogenic low back pain: Secondary | ICD-10-CM | POA: Diagnosis not present

## 2022-08-01 DIAGNOSIS — M5416 Radiculopathy, lumbar region: Secondary | ICD-10-CM | POA: Diagnosis not present

## 2022-08-29 DIAGNOSIS — M5459 Other low back pain: Secondary | ICD-10-CM | POA: Diagnosis not present

## 2022-08-29 DIAGNOSIS — M1712 Unilateral primary osteoarthritis, left knee: Secondary | ICD-10-CM | POA: Diagnosis not present

## 2022-08-29 DIAGNOSIS — M47816 Spondylosis without myelopathy or radiculopathy, lumbar region: Secondary | ICD-10-CM | POA: Diagnosis not present

## 2022-08-29 DIAGNOSIS — G8929 Other chronic pain: Secondary | ICD-10-CM | POA: Diagnosis not present

## 2022-09-05 DIAGNOSIS — M1712 Unilateral primary osteoarthritis, left knee: Secondary | ICD-10-CM | POA: Diagnosis not present

## 2022-09-21 DIAGNOSIS — H2513 Age-related nuclear cataract, bilateral: Secondary | ICD-10-CM | POA: Diagnosis not present

## 2022-09-21 DIAGNOSIS — H43812 Vitreous degeneration, left eye: Secondary | ICD-10-CM | POA: Diagnosis not present

## 2022-09-21 DIAGNOSIS — H34832 Tributary (branch) retinal vein occlusion, left eye, with macular edema: Secondary | ICD-10-CM | POA: Diagnosis not present

## 2022-10-04 DIAGNOSIS — H34832 Tributary (branch) retinal vein occlusion, left eye, with macular edema: Secondary | ICD-10-CM | POA: Diagnosis not present

## 2022-10-10 DIAGNOSIS — M170 Bilateral primary osteoarthritis of knee: Secondary | ICD-10-CM | POA: Diagnosis not present

## 2022-10-10 DIAGNOSIS — M25561 Pain in right knee: Secondary | ICD-10-CM | POA: Diagnosis not present

## 2022-10-10 DIAGNOSIS — M1712 Unilateral primary osteoarthritis, left knee: Secondary | ICD-10-CM | POA: Diagnosis not present

## 2022-10-15 ENCOUNTER — Ambulatory Visit (FREE_STANDING_LABORATORY_FACILITY): Payer: Medicare Other | Admitting: Family Medicine

## 2022-10-15 ENCOUNTER — Encounter (INDEPENDENT_AMBULATORY_CARE_PROVIDER_SITE_OTHER): Payer: Self-pay | Admitting: Family Medicine

## 2022-10-15 VITALS — BP 143/76 | HR 63 | Temp 98.0°F | Resp 16 | Ht 65.0 in | Wt 136.0 lb

## 2022-10-15 DIAGNOSIS — E1169 Type 2 diabetes mellitus with other specified complication: Secondary | ICD-10-CM | POA: Diagnosis not present

## 2022-10-15 DIAGNOSIS — E538 Deficiency of other specified B group vitamins: Secondary | ICD-10-CM | POA: Diagnosis not present

## 2022-10-15 DIAGNOSIS — D509 Iron deficiency anemia, unspecified: Secondary | ICD-10-CM | POA: Diagnosis not present

## 2022-10-15 DIAGNOSIS — M170 Bilateral primary osteoarthritis of knee: Secondary | ICD-10-CM | POA: Diagnosis not present

## 2022-10-15 DIAGNOSIS — E785 Hyperlipidemia, unspecified: Secondary | ICD-10-CM | POA: Diagnosis not present

## 2022-10-15 DIAGNOSIS — E113552 Type 2 diabetes mellitus with stable proliferative diabetic retinopathy, left eye: Secondary | ICD-10-CM | POA: Diagnosis not present

## 2022-10-15 DIAGNOSIS — E039 Hypothyroidism, unspecified: Secondary | ICD-10-CM | POA: Diagnosis not present

## 2022-10-15 DIAGNOSIS — M1711 Unilateral primary osteoarthritis, right knee: Secondary | ICD-10-CM

## 2022-10-15 DIAGNOSIS — M1712 Unilateral primary osteoarthritis, left knee: Secondary | ICD-10-CM | POA: Insufficient documentation

## 2022-10-15 LAB — COMPREHENSIVE METABOLIC PANEL
ALT: 17 U/L (ref 0–55)
AST (SGOT): 26 U/L (ref 5–41)
Albumin/Globulin Ratio: 1.5 (ref 0.9–2.2)
Albumin: 3.8 g/dL (ref 3.5–5.0)
Alkaline Phosphatase: 57 U/L (ref 37–117)
Anion Gap: 6 (ref 5.0–15.0)
BUN: 29 mg/dL — ABNORMAL HIGH (ref 9–28)
Bilirubin, Total: 0.5 mg/dL (ref 0.2–1.2)
CO2: 24 mEq/L (ref 17–29)
Calcium: 9 mg/dL (ref 7.9–10.2)
Chloride: 105 mEq/L (ref 99–111)
Creatinine: 1.2 mg/dL (ref 0.5–1.5)
GFR: >60 mL/min/{1.73_m2} (ref >=60.0)
Globulin: 2.6 g/dL (ref 2.0–3.6)
Glucose: 94 mg/dL (ref 70–100)
Hemolysis Index: 4 Index
Potassium: 5.2 mEq/L (ref 3.5–5.3)
Protein, Total: 6.4 g/dL (ref 6.0–8.3)
Sodium: 135 mEq/L (ref 135–145)

## 2022-10-15 LAB — LAB USE ONLY - CBC WITH DIFFERENTIAL
Absolute Basophils: 0.01 10*3/uL (ref 0.00–0.08)
Absolute Eosinophils: 0.09 10*3/uL (ref 0.00–0.44)
Absolute Immature Granulocytes: 0.02 10*3/uL (ref 0.00–0.07)
Absolute Lymphocytes: 3.87 10*3/uL — ABNORMAL HIGH (ref 0.42–3.22)
Absolute Monocytes: 0.91 10*3/uL — ABNORMAL HIGH (ref 0.21–0.85)
Absolute Neutrophils: 3.34 10*3/uL (ref 1.10–6.33)
Absolute nRBC: 0 10*3/uL (ref <=0.00)
Basophils %: 0.1 %
Eosinophils %: 1.1 %
Hematocrit: 40 % (ref 37.6–49.6)
Hemoglobin: 13.2 g/dL (ref 12.5–17.1)
Immature Granulocytes %: 0.2 %
Lymphocytes %: 47 %
MCH: 30.5 pg (ref 25.1–33.5)
MCHC: 33 g/dL (ref 31.5–35.8)
MCV: 92.4 fL (ref 78.0–96.0)
MPV: 10.1 fL (ref 8.9–12.5)
Monocytes %: 11 %
Neutrophils %: 40.6 %
Platelet Count: 249 10*3/uL (ref 142–346)
Preliminary Absolute Neutrophil Count: 3.34 10*3/uL (ref 1.10–6.33)
RBC: 4.33 10*6/uL (ref 4.20–5.90)
RDW: 13 % (ref 11–15)
WBC: 8.24 10*3/uL (ref 3.10–9.50)
nRBC %: 0 /100 WBC (ref <=0.0)

## 2022-10-15 LAB — HEMOGLOBIN A1C
Average Estimated Glucose: 162.8 mg/dL
Hemoglobin A1C: 7.3 % — ABNORMAL HIGH (ref 4.6–5.6)

## 2022-10-15 LAB — VITAMIN B12: Vitamin B-12: 740 pg/mL (ref 211–911)

## 2022-10-15 LAB — LIPID PANEL
Cholesterol / HDL Ratio: 2.2 Index
Cholesterol: 129 mg/dL (ref <=199)
HDL: 58 mg/dL (ref >=40)
LDL Calculated: 60 mg/dL (ref 0–129)
Triglycerides: 56 mg/dL (ref 34–149)
VLDL Calculated: 11 mg/dL (ref 10–40)

## 2022-10-15 LAB — IRON PROFILE
Iron Saturation: 26 % (ref 15–50)
Iron: 86 ug/dL (ref 41–168)
TIBC: 335 ug/dL (ref 261–462)
UIBC: 249 ug/dL (ref 126–382)

## 2022-10-15 LAB — URINE MICROALBUMIN, RANDOM
Urine Creatinine: 20 mg/dL
Urine Microalbumin/Creatinine Ratio: 31 ug/mg — ABNORMAL HIGH (ref <30)
Urine Microalbumin: 6.1 ug/ml (ref 0.0–30.0)

## 2022-10-15 LAB — T4, FREE: T4 Free: 1.27 ng/dL (ref 0.69–1.48)

## 2022-10-15 LAB — TSH: TSH: 0.88 u[IU]/mL (ref 0.35–4.94)

## 2022-10-15 MED ORDER — GLUCOSE BLOOD VI STRP
1.0000 | ORAL_STRIP | Freq: Every day | 3 refills | Status: AC
Start: 2022-10-15 — End: ?

## 2022-10-15 NOTE — Progress Notes (Signed)
Have you seen any specialists since your last visit with [place]?  No      The patient was informed that the following HM items are still outstanding:   [employer]

## 2022-10-15 NOTE — Patient Instructions (Signed)
Lab work today .   Eat healthy and avoid sugar and sweets as much as possible.   Exercise one hour a day or stay active as much as you can.  Thank you, Mounaf Tye Vigo, MD

## 2022-10-15 NOTE — Progress Notes (Signed)
Subjective:      Date: 10/15/2022 11:25 AM   Patient ID: Jason Trujillo is a 77 y.o. male.    Chief Complaint:  Chief Complaint   Patient presents with    Establish Care       HPI:  Diabetes , and the patient is taking the medication daily without side effects.  Hyperlipidemia , and the patient is taking the medication daily without side effects..   Had some fruits in the am.   Vitamin B 12 and iron levels needed.   Also taking thyroid medication.   Diet is good.   Weight is good.   No results found for: "HGBA1C"  No results found for: "GLU", "MICROALB", "LDL", "CREAT"  Last A1C was in April 7.4    Problem List:  Patient Active Problem List   Diagnosis    Type 2 diabetes mellitus with hyperlipidemia    Iron deficiency anemia, unspecified iron deficiency anemia type    B12 deficiency    Acquired hypothyroidism    Arthritis of knee, left    Arthritis of knee, right       Current Medications:  Outpatient Medications Marked as Taking for the 10/15/22 encounter (Office Visit) with Brayn Eckstein Mounaf, MD   Medication Sig Dispense Refill    aflibercept (Eylea) 2 MG/0.05ML Solution       Cholecalciferol (Vitamin D) 10 MCG/ML Liquid       empagliflozin (Jardiance) 10 MG tablet Take 1 tablet (10 mg) by mouth      Levothyroxine Sodium 175 MCG/ML Solution       losartan (COZAAR) 50 MG tablet Take 1 tablet (50 mg) by mouth daily      metFORMIN (GLUCOPHAGE) 1000 MG tablet Take 1 tablet (1,000 mg) by mouth 2 (two) times daily with meals      Multiple Vitamins-Minerals (CENTRUM ADULT 50+ MULTIGUMMIES PO)       rosuvastatin (CRESTOR) 5 MG tablet Take 1 tablet (5 mg) by mouth daily       Current Medications[1]      Allergies:  Allergies[2]      Past Medical History:  Medical History[3]    Past Surgical History:  History reviewed. No pertinent surgical history.    Family History:  Family History   Problem Relation Age of Onset    Diabetes Father        Social History:  Social History     Tobacco Use    Smoking status: Never    Smokeless  tobacco: Never   Vaping Use    Vaping status: Never Used   Substance Use Topics    Alcohol use: Never    Drug use: Never        The following sections were reviewed this encounter by the provider:   Tobacco  Allergies  Meds  Problems  Med Hx  Surg Hx  Fam Hx         ROS:  Review of system was negative except for what was mentioned in HPI    Objective:     Vitals:  BP 143/76 (BP Site: Left arm, Patient Position: Sitting, Cuff Size: Large)   Pulse 63   Temp 98 F (36.7 C) (Oral)   Resp 16   Ht 1.651 m (5\' 5" )   Wt 61.7 kg (136 lb)   SpO2 100%   BMI 22.63 kg/m     Physical Exam:  Physical Exam  General Examination:  GENERAL APPEARANCE: alert, in no acute distress, well developed, well nourished,  oriented to time, place, and person.   HEAD: normal appearance.   EYES: extraocular movement intact (EOMI), pupils equal, round, reactive to light and accommodation, sclera anicteric.   NECK/THYROID: neck supple,no lymphadenopathy, no thyromegaly.   SKIN: no rashes  HEART: S1, S2 normal, no murmurs, rubs, gallops, regular rate and rhythm.   LUNGS: normal effort / no distress, normal breath sounds, clear to auscultation bilaterally, no wheezes, rales, rhonchi.   ABDOMEN: bowel sounds present, no hepatosplenomegaly, soft, nontender, nondistended.   EXTREMITIES: no clubbing, cyanosis, or edema.   NEUROLOGIC: normal.   PSYCH: alert, oriented, cognitive function intact.    Assessment:       1. Type 2 diabetes mellitus with hyperlipidemia  - Hemoglobin A1C; Future  - Comprehensive Metabolic Panel; Future  - Lipid Panel; Future  - glucose blood test strip; 1 each by Other route daily Use as instructed  Dispense: 100 each; Refill: 3    2. Iron deficiency anemia, unspecified iron deficiency anemia type  - CBC with Differential (Order); Future  - Iron Profile; Future    3. B12 deficiency  - CBC with Differential (Order); Future  - Vitamin B12; Future    4. Acquired hypothyroidism  - TSH; Future  - T4, Free; Future    5.  Arthritis of knee, left    6. Arthritis of knee, right    7. Stable proliferative diabetic retinopathy of left eye associated with type 2 diabetes mellitus        Plan:   Please see patient instructions sheet for plan.       Follow-Up:   Return in about 3 months (around 01/15/2023) for Diabetes.    Patient Instructions   Lab work today.   Eat healthy and avoid sugar and sweets as much as possible.   Exercise one hour a day or stay active as much as you can.  Thank you, Cristal Generous, MD     Shawndale Kilpatrick Cristal Generous, MD         [1]   Current Outpatient Medications:     aflibercept (Eylea) 2 MG/0.05ML Solution, , Disp: , Rfl:     Cholecalciferol (Vitamin D) 10 MCG/ML Liquid, , Disp: , Rfl:     empagliflozin (Jardiance) 10 MG tablet, Take 1 tablet (10 mg) by mouth, Disp: , Rfl:     Levothyroxine Sodium 175 MCG/ML Solution, , Disp: , Rfl:     losartan (COZAAR) 50 MG tablet, Take 1 tablet (50 mg) by mouth daily, Disp: , Rfl:     metFORMIN (GLUCOPHAGE) 1000 MG tablet, Take 1 tablet (1,000 mg) by mouth 2 (two) times daily with meals, Disp: , Rfl:     Multiple Vitamins-Minerals (CENTRUM ADULT 50+ MULTIGUMMIES PO), , Disp: , Rfl:     rosuvastatin (CRESTOR) 5 MG tablet, Take 1 tablet (5 mg) by mouth daily, Disp: , Rfl:     glucose blood test strip, 1 each by Other route daily Use as instructed, Disp: 100 each, Rfl: 3  [2] No Known Allergies  [3]   Past Medical History:  Diagnosis Date    Diabetes mellitus     Disorder of thyroid

## 2022-10-17 DIAGNOSIS — M170 Bilateral primary osteoarthritis of knee: Secondary | ICD-10-CM | POA: Diagnosis not present

## 2022-11-02 ENCOUNTER — Telehealth (INDEPENDENT_AMBULATORY_CARE_PROVIDER_SITE_OTHER): Payer: Self-pay | Admitting: Family

## 2022-11-02 ENCOUNTER — Other Ambulatory Visit (INDEPENDENT_AMBULATORY_CARE_PROVIDER_SITE_OTHER): Payer: Self-pay

## 2022-11-02 DIAGNOSIS — U071 COVID-19: Secondary | ICD-10-CM

## 2022-11-02 MED ORDER — NIRMATRELVIR&RITONAVIR 300/100 20 X 150 MG & 10 X 100MG PO TBPK
3.0000 | ORAL_TABLET | Freq: Two times a day (BID) | ORAL | 0 refills | Status: AC
Start: 2022-11-02 — End: 2022-11-07

## 2022-11-02 NOTE — Telephone Encounter (Signed)
On call note:    Son and pt reports + home covid positive - stable at home and just took the test and is requesting paxlovid.     I reviewed covid 45 with pt and son today and that is symptoms are worsening and unable to manage with supportive care at home or if they are feeling that he is at risk and would like to discuss starting paxlovid that pt should be fully evaluated and have a full appt to discuss risk vs benefits as well as possible drug interactions if on medication. Since it is after hours and a Friday if they have concerns should be seen in urgent care. They verbalized understanding.     I did discuss that I would sent message to his PCP to follow-up if needed and to make PCP aware that they had called.

## 2022-11-07 ENCOUNTER — Encounter (INDEPENDENT_AMBULATORY_CARE_PROVIDER_SITE_OTHER): Payer: Self-pay | Admitting: Nurse Practitioner

## 2022-11-07 ENCOUNTER — Ambulatory Visit (INDEPENDENT_AMBULATORY_CARE_PROVIDER_SITE_OTHER): Payer: Medicare Other | Admitting: Nurse Practitioner

## 2022-11-07 VITALS — BP 124/67 | HR 69 | Temp 98.5°F | Ht 65.0 in | Wt 135.0 lb

## 2022-11-07 DIAGNOSIS — E039 Hypothyroidism, unspecified: Secondary | ICD-10-CM | POA: Diagnosis not present

## 2022-11-07 DIAGNOSIS — E1169 Type 2 diabetes mellitus with other specified complication: Secondary | ICD-10-CM | POA: Diagnosis not present

## 2022-11-07 DIAGNOSIS — R051 Acute cough: Secondary | ICD-10-CM | POA: Diagnosis not present

## 2022-11-07 DIAGNOSIS — E785 Hyperlipidemia, unspecified: Secondary | ICD-10-CM | POA: Diagnosis not present

## 2022-11-07 LAB — IHS AMB POCT SOFIA (TM) COVID-19 & FLU A/B
Sofia Influenza A Ag POCT: NEGATIVE
Sofia Influenza B Ag POCT: NEGATIVE
Sofia SARS COV2 Antigen POCT: NEGATIVE

## 2022-11-07 MED ORDER — BENZONATATE 100 MG PO CAPS
100.0000 mg | ORAL_CAPSULE | Freq: Two times a day (BID) | ORAL | 0 refills | Status: AC | PRN
Start: 2022-11-07 — End: 2022-11-14

## 2022-11-07 NOTE — Patient Instructions (Addendum)
---  Diabetes Mellitus  ---Eat healthy and avoid sugar, simple carbohydrates and sweets as much as possible.   ---Avoid fruits likes grapes, mangoes and banana.   --Eat fruits such as blueberries, raspberries, black berries, apples etc  ---Eat Low glycemic index foods and fruits.   ----Avoid bread/pasta/cakes/pastries etc   --Increase vegetables and fiber, decrease saturated fats and Omega fats like red meats.   ---Decrease fried foods, cheeses, fast food, and processed food etc

## 2022-11-07 NOTE — Progress Notes (Addendum)
Subjective:      Date: 11/07/2022 4:00 PM   Patient ID: Jason Trujillo is a 78 y.o. male.    Chief Complaint:  Chief Complaint   Patient presents with    covid positive     8/23     Cough     Sneezing and eye discharge     HPI:  Patient is accompanied by his wife.    Patient is here for a follow up visit  Patient has a past medical history of type 2 DM and Hypothyroidism.     Patient reports that he tested positive for Covid last Friday appro 6 days ago.   Patient states that he was prescribed Paxlovid. He has completed his Paxlovid.   Patient is complaining of a productive cough.   Patient denies any fever or chills.     Patient denies any chest pain, shortness of breathe, dizziness, or headache.       DM  --taking metformin and Jardiance.   ---tolerating medications well with no adverse effects.   Denies any tremors, changes in vision, loss of consciousness.        Hypothyroidism  Patient reports medication compliance, taking medication daily on an empty stomach.   Patient  denies any cold intolerance, constipation, fatigue, changes in hair or nails.   Patient denies any palpitaitons, headaches, or  tremors.     Problem List:  Patient Active Problem List   Diagnosis    Type 2 diabetes mellitus with hyperlipidemia    Iron deficiency anemia, unspecified iron deficiency anemia type    B12 deficiency    Acquired hypothyroidism    Arthritis of knee, left    Arthritis of knee, right     Current Medications:  Outpatient Medications Marked as Taking for the 11/07/22 encounter (Office Visit) with Elyn Peers, NP   Medication Sig Dispense Refill    aflibercept Gretta Cool) 2 MG/0.05ML Solution       Cholecalciferol (Vitamin D) 10 MCG/ML Liquid       empagliflozin (Jardiance) 10 MG tablet Take 1 tablet (10 mg) by mouth      glucose blood test strip 1 each by Other route daily Use as instructed 100 each 3    Levothyroxine Sodium 175 MCG/ML Solution       losartan (COZAAR) 50 MG tablet Take 1 tablet (50 mg) by mouth daily       metFORMIN (GLUCOPHAGE) 1000 MG tablet Take 1 tablet (1,000 mg) by mouth 2 (two) times daily with meals      Multiple Vitamins-Minerals (CENTRUM ADULT 50+ MULTIGUMMIES PO)       nirmatrelvir-ritonavir (PAXLOVID) 20 x 150 MG & 10 x 100MG  dose pack(emergency use authorization) Take 3 tablets by mouth 2 (two) times daily for 5 days The dosage for PAXLOVID is 300 mg nirmatrelvir (two 150 mg tablets) with 100 mg ritonavir (one 100 mg tablet) with all three tablets taken together. 30 tablet 0    rosuvastatin (CRESTOR) 5 MG tablet Take 1 tablet (5 mg) by mouth daily       Allergies:  No Known Allergies    Past Medical History:  Past Medical History:   Diagnosis Date    Diabetes mellitus     Disorder of thyroid      Past Surgical History:  History reviewed. No pertinent surgical history.    Family History:  Family History   Problem Relation Age of Onset    Diabetes Father      Social History:  Social  History     Tobacco Use    Smoking status: Never    Smokeless tobacco: Never   Vaping Use    Vaping status: Never Used   Substance Use Topics    Alcohol use: Never    Drug use: Never        The following sections were reviewed this encounter by the provider:   Tobacco  Allergies  Meds  Problems  Med Hx  Surg Hx  Fam Hx           ROS:  Review of Systems    Review of system was negative except for what was mentioned in HPI    Objective:     Vitals:  BP 124/67 (BP Site: Left arm, Patient Position: Sitting, Cuff Size: Large)   Pulse 69   Temp 98.5 F (36.9 C)   Ht 1.651 m (5\' 5" )   Wt 61.2 kg (135 lb)   SpO2 99%   BMI 22.47 kg/m     Physical Exam:  Physical Exam  General Examination:  GENERAL APPEARANCE: alert, in no acute distress, well developed, well nourished, oriented to time, place, and person.   HEAD: normal appearance.   EYES: extraocular movement intact (EOMI), pupils equal, round, reactive to light and accommodation, sclera anicteric.   NECK/THYROID: neck supple,no lymphadenopathy, no thyromegaly.   SKIN: no  rashes  HEART: S1, S2 normal, no murmurs, rubs, gallops, regular rate and rhythm.   LUNGS: normal effort / no distress, normal breath sounds, clear to auscultation bilaterally, no wheezes, rales, rhonchi.   ABDOMEN: bowel sounds present, no hepatosplenomegaly, soft, nontender, nondistended.   EXTREMITIES: no clubbing, cyanosis, or edema.   NEUROLOGIC: normal.   PSYCH: alert, oriented, cognitive function intact.    Assessment:       1. Acute cough  - benzonatate (TESSALON) 100 MG capsule; Take 1 capsule (100 mg) by mouth 2 (two) times daily as needed for Cough  Dispense: 30 capsule; Refill: 0    2. Type 2 diabetes mellitus with hyperlipidemia    3. Acquired hypothyroidism  Continue current treatment     Plan:   Please see patient instructions sheet for plan.     POCT Covid is negative.     Acute cough---- will start Tessalon perles BID,   Increase fluids.     DM  Avoid sugars, fast food, and simple carbohydrates  Increase protein, and fiber rich foods  Increase fluids intake unless you are on fluid restriction  Increase physical activity such as daily walking for 30 minutes     Hypothyroidism  Continue current treatment     Follow-Up:   Return if symptoms worsen or fail to improve.    Patient Instructions     ---Diabetes Mellitus  ---Eat healthy and avoid sugar, simple carbohydrates and sweets as much as possible.   ---Avoid fruits likes grapes, mangoes and banana.   --Eat fruits such as blueberries, raspberries, black berries, apples etc  ---Eat Low glycemic index foods and fruits.   ----Avoid bread/pasta/cakes/pastries etc   --Increase vegetables and fiber, decrease saturated fats and Omega fats like red meats.   ---Decrease fried foods, cheeses, fast food, and processed food etc

## 2022-11-07 NOTE — Progress Notes (Signed)
Have you seen any specialists since your last visit with [place]?  No      The patient was informed that the following HM items are still outstanding:   eye exam, influenza vaccine, shingles vaccine, pneumonia vaccine, and tdap

## 2022-11-08 NOTE — Addendum Note (Signed)
Addended by: Darlen Round on: 11/08/2022 09:32 AM     Modules accepted: Orders

## 2022-11-20 ENCOUNTER — Other Ambulatory Visit (INDEPENDENT_AMBULATORY_CARE_PROVIDER_SITE_OTHER): Payer: Self-pay | Admitting: Family Medicine

## 2022-11-21 DIAGNOSIS — M170 Bilateral primary osteoarthritis of knee: Secondary | ICD-10-CM | POA: Diagnosis not present

## 2022-11-28 DIAGNOSIS — M170 Bilateral primary osteoarthritis of knee: Secondary | ICD-10-CM | POA: Diagnosis not present

## 2022-12-05 DIAGNOSIS — M170 Bilateral primary osteoarthritis of knee: Secondary | ICD-10-CM | POA: Diagnosis not present

## 2023-01-15 ENCOUNTER — Ambulatory Visit (INDEPENDENT_AMBULATORY_CARE_PROVIDER_SITE_OTHER): Payer: Medicare Other | Admitting: Family Medicine

## 2023-01-16 ENCOUNTER — Ambulatory Visit (INDEPENDENT_AMBULATORY_CARE_PROVIDER_SITE_OTHER): Payer: Medicare Other | Admitting: Family Medicine

## 2023-01-16 ENCOUNTER — Encounter (INDEPENDENT_AMBULATORY_CARE_PROVIDER_SITE_OTHER): Payer: Self-pay | Admitting: Family Medicine

## 2023-01-16 VITALS — BP 134/77 | HR 74 | Temp 98.6°F | Resp 12 | Ht 64.0 in | Wt 138.0 lb

## 2023-01-16 DIAGNOSIS — Z0000 Encounter for general adult medical examination without abnormal findings: Secondary | ICD-10-CM

## 2023-01-16 DIAGNOSIS — E785 Hyperlipidemia, unspecified: Secondary | ICD-10-CM

## 2023-01-16 DIAGNOSIS — E1169 Type 2 diabetes mellitus with other specified complication: Secondary | ICD-10-CM

## 2023-01-16 DIAGNOSIS — I10 Essential (primary) hypertension: Secondary | ICD-10-CM

## 2023-01-16 DIAGNOSIS — E039 Hypothyroidism, unspecified: Secondary | ICD-10-CM

## 2023-01-16 DIAGNOSIS — E782 Mixed hyperlipidemia: Secondary | ICD-10-CM

## 2023-01-16 DIAGNOSIS — J0100 Acute maxillary sinusitis, unspecified: Secondary | ICD-10-CM

## 2023-01-16 LAB — COMPREHENSIVE METABOLIC PANEL
ALT: 19 U/L (ref <=55)
AST (SGOT): 30 U/L (ref <=41)
Albumin/Globulin Ratio: 1.4 (ref 0.9–2.2)
Albumin: 4.2 g/dL (ref 3.5–5.0)
Alkaline Phosphatase: 60 U/L (ref 37–117)
Anion Gap: 7 (ref 5.0–15.0)
BUN: 30 mg/dL — ABNORMAL HIGH (ref 9–28)
Bilirubin, Total: 1 mg/dL (ref 0.2–1.2)
CO2: 24 meq/L (ref 17–29)
Calcium: 9.2 mg/dL (ref 7.9–10.2)
Chloride: 104 meq/L (ref 99–111)
Creatinine: 1.1 mg/dL (ref 0.5–1.5)
GFR: >60 mL/min/{1.73_m2} (ref >=60.0)
Globulin: 3.1 g/dL (ref 2.0–3.6)
Glucose: 117 mg/dL — ABNORMAL HIGH (ref 70–100)
Hemolysis Index: 15 {index}
Potassium: 5.3 meq/L (ref 3.5–5.3)
Protein, Total: 7.3 g/dL (ref 6.0–8.3)
Sodium: 135 meq/L (ref 135–145)

## 2023-01-16 LAB — LIPID PANEL
Cholesterol / HDL Ratio: 2.1 {index}
Cholesterol: 140 mg/dL (ref <=199)
HDL: 68 mg/dL (ref >=40)
LDL Calculated: 60 mg/dL (ref 0–99)
Triglycerides: 60 mg/dL (ref 34–149)
VLDL Calculated: 12 mg/dL (ref 10–40)

## 2023-01-16 LAB — HEMOGLOBIN A1C
Average Estimated Glucose: 148.5 mg/dL
Hemoglobin A1C: 6.8 % — ABNORMAL HIGH (ref 4.6–5.6)

## 2023-01-16 LAB — T4, FREE: T4 Free: 1.11 ng/dL (ref 0.69–1.48)

## 2023-01-16 LAB — TSH: TSH: 1.34 u[IU]/mL (ref 0.35–4.94)

## 2023-01-16 MED ORDER — METFORMIN HCL 1000 MG PO TABS
1000.0000 mg | ORAL_TABLET | Freq: Two times a day (BID) | ORAL | 3 refills | Status: DC
Start: 2023-01-16 — End: 2023-06-20

## 2023-01-16 MED ORDER — AZITHROMYCIN 250 MG PO TABS
ORAL_TABLET | ORAL | 0 refills | Status: AC
Start: 2023-01-16 — End: 2023-01-22

## 2023-01-16 MED ORDER — LOSARTAN POTASSIUM 50 MG PO TABS
50.0000 mg | ORAL_TABLET | Freq: Every day | ORAL | 3 refills | Status: AC
Start: 2023-01-16 — End: ?

## 2023-01-16 MED ORDER — ROSUVASTATIN CALCIUM 5 MG PO TABS
5.0000 mg | ORAL_TABLET | Freq: Every day | ORAL | 3 refills | Status: AC
Start: 2023-01-16 — End: ?

## 2023-01-16 MED ORDER — LEVOTHYROXINE SODIUM 75 MCG PO TABS
75.0000 ug | ORAL_TABLET | Freq: Every morning | ORAL | 3 refills | Status: DC
Start: 2023-01-16 — End: 2023-06-20

## 2023-01-16 NOTE — Progress Notes (Signed)
 Subjective:      Date: 01/16/2023 9:30 AM   Patient ID: Jason Trujillo is a 77 y.o. male.    Chief Complaint:  Chief Complaint   Patient presents with    Annual Exam     Fasting    Cough    Sinusitis           Sore Throat       HPI:  Sore throat  Sinus pain

## 2023-01-16 NOTE — Addendum Note (Signed)
 Addended byFlonnie Overman, Corabelle Spackman Lauraine Rinne on: 01/16/2023 09:34 AM     Modules accepted: Orders

## 2023-01-16 NOTE — Patient Instructions (Signed)
 Antibiotics for the sinus infection.  Eat healthy and try to cut back on sugar and sweets as much as possible.  Exercise one hour a day or stay active as much as you can.  Thank you, Cristal Generous, MD

## 2023-01-16 NOTE — Progress Notes (Signed)
 [facility]    Garik Diamant is a 77 y.o. male who presents today for the following Medicare Wellness Visit:  []  Initial Preventive Physical Exam (IPPE) - "Welcome to Medicare" preventive visit (Vision Screening required)   []  Annu

## 2023-04-02 ENCOUNTER — Encounter (INDEPENDENT_AMBULATORY_CARE_PROVIDER_SITE_OTHER): Payer: Self-pay

## 2023-04-05 ENCOUNTER — Ambulatory Visit (INDEPENDENT_AMBULATORY_CARE_PROVIDER_SITE_OTHER): Payer: Medicare Other | Admitting: Internal Medicine

## 2023-04-05 ENCOUNTER — Encounter (INDEPENDENT_AMBULATORY_CARE_PROVIDER_SITE_OTHER): Payer: Self-pay | Admitting: Internal Medicine

## 2023-04-05 VITALS — BP 117/70 | HR 73 | Temp 99.6°F | Resp 17 | Ht 64.0 in | Wt 133.6 lb

## 2023-04-05 DIAGNOSIS — M170 Bilateral primary osteoarthritis of knee: Secondary | ICD-10-CM

## 2023-04-05 DIAGNOSIS — I10 Essential (primary) hypertension: Secondary | ICD-10-CM

## 2023-04-05 DIAGNOSIS — E039 Hypothyroidism, unspecified: Secondary | ICD-10-CM

## 2023-04-05 DIAGNOSIS — E113552 Type 2 diabetes mellitus with stable proliferative diabetic retinopathy, left eye: Secondary | ICD-10-CM | POA: Insufficient documentation

## 2023-04-05 DIAGNOSIS — E1142 Type 2 diabetes mellitus with diabetic polyneuropathy: Secondary | ICD-10-CM

## 2023-04-05 DIAGNOSIS — E782 Mixed hyperlipidemia: Secondary | ICD-10-CM

## 2023-04-05 NOTE — Progress Notes (Signed)
 [facility] PRIMARY CARE  [place]  PROGRESS NOTE      Patient: Jason Trujillo   Date: 04/05/2023   MRN: [id number]     ASSESSMENT/PLAN     Jason Trujillo is a 78 y.o. male    Chief Complaint   Patient presents with    Establish Care     Pt wanted to establish care with a new physician  Pt also states that [place] was [place] for the past 2 months and wanted to complete labs    Diabetes Follow-up    Hypertension    Hyperlipidemia          1. Type 2 diabetes mellitus with diabetic polyneuropathy, without long-term current use of insulin (Primary)  Your treatment plan for DM:    A. Continue these medications: Metformin  1 g twice a day and Jardiance 10 mg daily.    Patient noted a weight loss after starting Jardiance couple years ago.  His weight was around 150-155 before starting Jardiance.  It has gradually reduced to around 135.  It has been steady for the last 1 year.  He is somewhat concerned about it.  He is following high-protein diet.  Appetite is good.    B: Lifestyle changes - Choices you make every day about the foods you eat and the way you live can have a big impact on your general health.   Here are some thingsyou can do to reduce your health risks:  Make healthy food choices - Eat lots of fruits, vegetables, whole grains, and low-fat dairy products. Limit the amount of meat and fried or fatty foods.  Be active - Walk, garden, or do something active for 30 minutes or more on most days of the week.  Avoid smoking - Smoking increases the chance that you will have a heart attackor stroke, or develop cancer.  Lose weight - Being overweight increases the risk of many health problems.  Avoid alcohol - Alcohol can increase blood sugar and blood pressure.    C: Prevention of complications:      Get a dilated eye examination once a year.      Get A1c blood test and urine microalbumin test at least once a year.      Get diabetic feet care       Check your blood sugars as recommended and bring blood sugar records at next  visit.    - Comprehensive Metabolic Panel; Future  - CBC with Differential (Order); Future  - Lipid Panel; Future  - Hemoglobin A1C; Future  - Urine Microalbumin, Random; Future  - Urinalysis with Microscopic Exam; Future    2. Stable proliferative diabetic retinopathy of left eye associated with type 2 diabetes mellitus/macular degeneration left eye  Followed by retinal specialist at [facility] and gets injection every 3 months in the left    3. Mixed hyperlipidemia  Currently on rosuvastatin  5 mg daily.  Continue to follow low-carb low-fat diet and exercise regularly.  - Comprehensive Metabolic Panel; Future  - CBC with Differential (Order); Future  - Lipid Panel; Future    4. Acquired hypothyroidism  Currently taking Synthroid  75 mcg daily.  Following labs are ordered to monitor.  - TSH; Future  - T4, Free; Future    5. Essential hypertension, benign  Blood pressure is well-controlled.  Continue losartan  50 mg daily.  Trace pedal edema noted.  This is probably due to recent travel from [place].  Monitor it.  Reduce salt intake  - Comprehensive Metabolic Panel; Future  -  CBC with Differential (Order); Future  - Lipid Panel; Future  - Urinalysis with Microscopic Exam; Future    6. Primary osteoarthritis of knees, bilateral  Patient has seen orthopedic surgeon and had injection in the knee joints.  May use Tylenol or Motrin as needed.  Pain in the lower legs could be related to neuropathy from diabetes.  Currently patient is able to manage with OTC medication.  If pain gets worse, may consider gabapentin.           MEDICATIONS     Current Medications[1]    Allergies[2]    SUBJECTIVE     Chief Complaint   Patient presents with    Establish Care     Pt wanted to establish care with a new physician  Pt also states that he was overseas for the past 2 months and wanted to complete labs    Diabetes Follow-up    Hypertension    Hyperlipidemia        Patient has moved to this area from [place]  to stay with his  daughters who work in [place].  I have reviewed past medical history which includes diabetes hypertension hyperlipidemia and hypothyroidism.  He wants to establish care with me.    Diabetes Follow-up  The patient is being seen for routine monitoring of diabetes. Diabetes is classified as type II and non-insulin requiring.   Diabetes was diagnosed a year ago.   Diabetes complications include diabetic peripheral neuropathy.  Comorbid illnesses include hyperlipidemia and hypertension.  There are no recent interval events.    Patient denies hypoglycemia, polyphagia and polyuria. Current therapy includes metformin , SGLT2 inhibitor and lifestyle changes.  Patient is compliant with the current regimen.  He monitors blood glucose at home 1-2 x per day.   The patient's prior A1c result is at goal < 7.      Hypertension  The patient is being seen for an initial evaluation of hypertension. Hypertension is classified as essential. There is no history of CHF. Comorbid illnesses include hyperlipidemia and diabetes. There are no recent interval events.  Interval symptoms include lower extremity edema.   There is no interval history of chest pain, dyspnea and orthopnea.  Current therapy includes ARBs and lifestyle changes. Patient is compliant with the current regimen.   The patient's blood pressure response to medications is at goal. Average ambulatory systolic blood pressure is generally in the 100's. Average ambulatory diastolic blood pressure is generally in the 60's. Lifestyle changes have included improved nutrition, decreased salt intake and increased exercise.    Hyperlipidemia  The patient is being seen for an initial evaluation of hyperlipidemia.  Hyperlipidemia is classified as combined hyperlipidemia. Comorbid illnesses include hypertension, diabetes and hypothyroidism.  There are no recent interval events.  Interval symptoms include lower extremity edema. There is no interval history of myalgias, dyspnea and orthopnea.  Current therapy includes a statin and lifestyle changes.  Patient is compliant with the current regimen. The LDL is at goal.      Hypothyroidism  The patient is being seen for an initial evaluation of hypothyroidism.  Hypothyroidism is classified as acquired. There are no known risk factors.  There was no prior imaging performed. Comorbid illnesses include hyperlipidemia and diabetes.  There are no recent interval events.  Interval symptoms include weight loss. There is no interval history of anxiety and fatigue. Current therapy includes levothyroxine .  Patient is compliant with the current regimen. The patient's response to medication is TSH at goal.  ROS     Review of Systems   Constitutional:  Positive for unexpected weight change. Negative for diaphoresis, fatigue and fever.   Respiratory:  Negative for shortness of breath and wheezing.    Cardiovascular:  Positive for leg swelling. Negative for chest pain and palpitations.   Endocrine: Negative for polyphagia and polyuria.   Genitourinary:  Negative for difficulty urinating.   Neurological:  Negative for dizziness and headaches.   All other systems reviewed and are negative.      Problem List[3]    Medical History[4]        The following portions of the patient's history were reviewed and updated as appropriate: Allergies, Current Medications, Past Family History, Past Medical history, Past social history, Past surgical history, and Problem List.      PHYSICAL EXAM     Vitals:    04/05/23 1405   BP: 117/70   BP Site: Left arm   Patient Position: Sitting   Cuff Size: Medium   Pulse: 73   Resp: 17   Temp: 99.6 F (37.6 C)   TempSrc: Temporal   SpO2: 98%   Weight: 60.6 kg (133 lb 9.6 oz)   Height: 1.626 m (5' 4)         Physical Exam  Vitals reviewed.   Constitutional:       Appearance: Normal appearance. He is normal weight.   HENT:      Nose: Nose normal.      Mouth/Throat:      Mouth: Mucous membranes are dry.      Pharynx: Oropharynx is clear.    Eyes:      Extraocular Movements: Extraocular movements intact.      Conjunctiva/sclera: Conjunctivae normal.      Pupils: Pupils are equal, round, and reactive to light.   Neck:      Vascular: No carotid bruit.   Cardiovascular:      Rate and Rhythm: Normal rate and regular rhythm.      Pulses: Normal pulses.      Heart sounds: Normal heart sounds.   Pulmonary:      Effort: Pulmonary effort is normal.      Breath sounds: Normal breath sounds. No wheezing, rhonchi or rales.   Musculoskeletal:         General: Normal range of motion.      Cervical back: Normal range of motion and neck supple.      Right lower leg: Edema present.      Left lower leg: Edema present.   Lymphadenopathy:      Cervical: No cervical adenopathy.   Skin:     General: Skin is warm.      Findings: No rash.   Neurological:      General: No focal deficit present.      Mental Status: He is alert and oriented to person, place, and time.   Psychiatric:         Mood and Affect: Mood normal.         Behavior: Behavior normal.               Signed,  Eleanore KANDICE Blanch, MD, MD  04/05/2023         [1]   Current Outpatient Medications   Medication Sig Dispense Refill    aflibercept (Eylea) 2 MG/0.05ML Solution       Blood Glucose Calibration (Accu-Chek Aviva) Solution       Cholecalciferol (Vitamin D) 10 MCG/ML Liquid  empagliflozin (Jardiance) 10 MG tablet Take 1 tablet (10 mg) by mouth      glucose blood test strip 1 each by Other route daily Use as instructed 100 each 3    levothyroxine  (SYNTHROID ) 75 MCG tablet Take 1 tablet (75 mcg) by mouth every morning 90 tablet 3    losartan  (COZAAR ) 50 MG tablet Take 1 tablet (50 mg) by mouth daily 90 tablet 3    metFORMIN  (GLUCOPHAGE ) 1000 MG tablet Take 1 tablet (1,000 mg) by mouth 2 (two) times daily with meals 180 tablet 3    Multiple Vitamins-Minerals (CENTRUM ADULT 50+ MULTIGUMMIES PO)       rosuvastatin  (CRESTOR ) 5 MG tablet Take 1 tablet (5 mg) by mouth daily 90 tablet 3     No current  facility-administered medications for this visit.   [2] No Known Allergies  [3]   Patient Active Problem List  Diagnosis    Type 2 diabetes mellitus with hyperlipidemia    Iron deficiency anemia, unspecified iron deficiency anemia type    B12 deficiency    Acquired hypothyroidism    Arthritis of knee, left    Arthritis of knee, right    Stable proliferative diabetic retinopathy of left eye associated with type 2 diabetes mellitus   [4]   Past Medical History:  Diagnosis Date    Arthritis     Knees    Diabetes mellitus 1995    Disorder of thyroid 2021    Hyperlipidemia     Hypertension     Macular degeneration of left eye     injection every 3 momths RGW

## 2023-04-05 NOTE — Progress Notes (Signed)
 Have you seen any specialists since your last visit with [place]?  No, new pt      The patient was informed that the following HM items are still outstanding:   eye exam, shingles vaccine, and pneumonia vaccine

## 2023-04-22 ENCOUNTER — Other Ambulatory Visit (FREE_STANDING_LABORATORY_FACILITY): Payer: Medicare Other

## 2023-04-22 DIAGNOSIS — E782 Mixed hyperlipidemia: Secondary | ICD-10-CM

## 2023-04-22 DIAGNOSIS — I10 Essential (primary) hypertension: Secondary | ICD-10-CM

## 2023-04-22 DIAGNOSIS — E1142 Type 2 diabetes mellitus with diabetic polyneuropathy: Secondary | ICD-10-CM

## 2023-04-22 DIAGNOSIS — E039 Hypothyroidism, unspecified: Secondary | ICD-10-CM

## 2023-04-22 LAB — LAB USE ONLY - CBC WITH DIFFERENTIAL
Absolute Basophils: 0.01 10*3/uL (ref 0.00–0.08)
Absolute Eosinophils: 0.13 10*3/uL (ref 0.00–0.44)
Absolute Immature Granulocytes: 0.01 10*3/uL (ref 0.00–0.07)
Absolute Lymphocytes: 2.6 10*3/uL (ref 0.42–3.22)
Absolute Monocytes: 0.56 10*3/uL (ref 0.21–0.85)
Absolute Neutrophils: 2.41 10*3/uL (ref 1.10–6.33)
Absolute nRBC: 0 10*3/uL (ref <=0.00)
Basophils %: 0.2 %
Eosinophils %: 2.3 %
Hematocrit: 37.6 % (ref 37.6–49.6)
Hemoglobin: 12.4 g/dL — ABNORMAL LOW (ref 12.5–17.1)
Immature Granulocytes %: 0.2 %
Lymphocytes %: 45.5 %
MCH: 30.1 pg (ref 25.1–33.5)
MCHC: 33 g/dL (ref 31.5–35.8)
MCV: 91.3 fL (ref 78.0–96.0)
MPV: 10.2 fL (ref 8.9–12.5)
Monocytes %: 9.8 %
Neutrophils %: 42 %
Platelet Count: 254 10*3/uL (ref 142–346)
Preliminary Absolute Neutrophil Count: 2.41 10*3/uL (ref 1.10–6.33)
RBC: 4.12 10*6/uL — ABNORMAL LOW (ref 4.20–5.90)
RDW: 13 % (ref 11–15)
WBC: 5.72 10*3/uL (ref 3.10–9.50)
nRBC %: 0 /100{WBCs} (ref <=0.0)

## 2023-04-22 LAB — COMPREHENSIVE METABOLIC PANEL
ALT: 23 U/L (ref <=55)
AST (SGOT): 33 U/L (ref <=41)
Albumin/Globulin Ratio: 1.4 (ref 0.9–2.2)
Albumin: 3.9 g/dL (ref 3.5–5.0)
Alkaline Phosphatase: 66 U/L (ref 37–117)
Anion Gap: 7 (ref 5.0–15.0)
BUN: 34 mg/dL — ABNORMAL HIGH (ref 9–28)
Bilirubin, Total: 0.6 mg/dL (ref 0.2–1.2)
CO2: 23 meq/L (ref 17–29)
Calcium: 8.6 mg/dL (ref 7.9–10.2)
Chloride: 105 meq/L (ref 99–111)
Creatinine: 1.2 mg/dL (ref 0.5–1.5)
GFR: >60 mL/min/{1.73_m2} (ref >=60.0)
Globulin: 2.7 g/dL (ref 2.0–3.6)
Glucose: 97 mg/dL (ref 70–100)
Hemolysis Index: 8 {index}
Potassium: 5 meq/L (ref 3.5–5.3)
Protein, Total: 6.6 g/dL (ref 6.0–8.3)
Sodium: 135 meq/L (ref 135–145)

## 2023-04-22 LAB — URINE MICROALBUMIN, RANDOM
Urine Creatinine: 71 mg/dL
Urine Microalbumin/Creatinine Ratio: 28 ug/mg (ref <30)
Urine Microalbumin: 20 ug/mL (ref 0.0–30.0)

## 2023-04-22 LAB — URINALYSIS WITH MICROSCOPIC EXAM
Urine Bilirubin: NEGATIVE
Urine Blood: NEGATIVE
Urine Ketones: NEGATIVE mg/dL
Urine Leukocyte Esterase: NEGATIVE
Urine Nitrite: NEGATIVE
Urine Protein: NEGATIVE
Urine Specific Gravity: 1.018 (ref 1.001–1.035)
Urine Urobilinogen: NORMAL mg/dL (ref 0.2–2.0)
Urine pH: 6 (ref 5.0–8.0)

## 2023-04-22 LAB — T4, FREE: T4 Free: 1.19 ng/dL (ref 0.69–1.48)

## 2023-04-22 LAB — LIPID PANEL
Cholesterol / HDL Ratio: 2.4 {index}
Cholesterol: 140 mg/dL (ref <=199)
HDL: 59 mg/dL (ref >=40)
LDL Calculated: 71 mg/dL (ref 0–99)
Triglycerides: 51 mg/dL (ref 34–149)
VLDL Calculated: 10 mg/dL (ref 10–40)

## 2023-04-22 LAB — HEMOGLOBIN A1C
Average Estimated Glucose: 154.2 mg/dL
Hemoglobin A1C: 7 % — ABNORMAL HIGH (ref 4.6–5.6)

## 2023-04-22 LAB — TSH: TSH: 0.77 u[IU]/mL (ref 0.35–4.94)

## 2023-04-30 ENCOUNTER — Encounter (INDEPENDENT_AMBULATORY_CARE_PROVIDER_SITE_OTHER): Payer: Medicare Other | Admitting: Student in an Organized Health Care Education/Training Program

## 2023-05-01 ENCOUNTER — Ambulatory Visit (INDEPENDENT_AMBULATORY_CARE_PROVIDER_SITE_OTHER): Payer: Medicare Other | Admitting: Family Medicine

## 2023-06-07 IMAGING — CR DG KNEE 1-2V*L*
2 series · 2 of 2 positions shown · non-contrast
Comparison: None.

CLINICAL DATA: Left greater than right knee pain. Question
osteoarthritis.

EXAM:
LEFT KNEE - 1-2 VIEW

[w knee ap left]
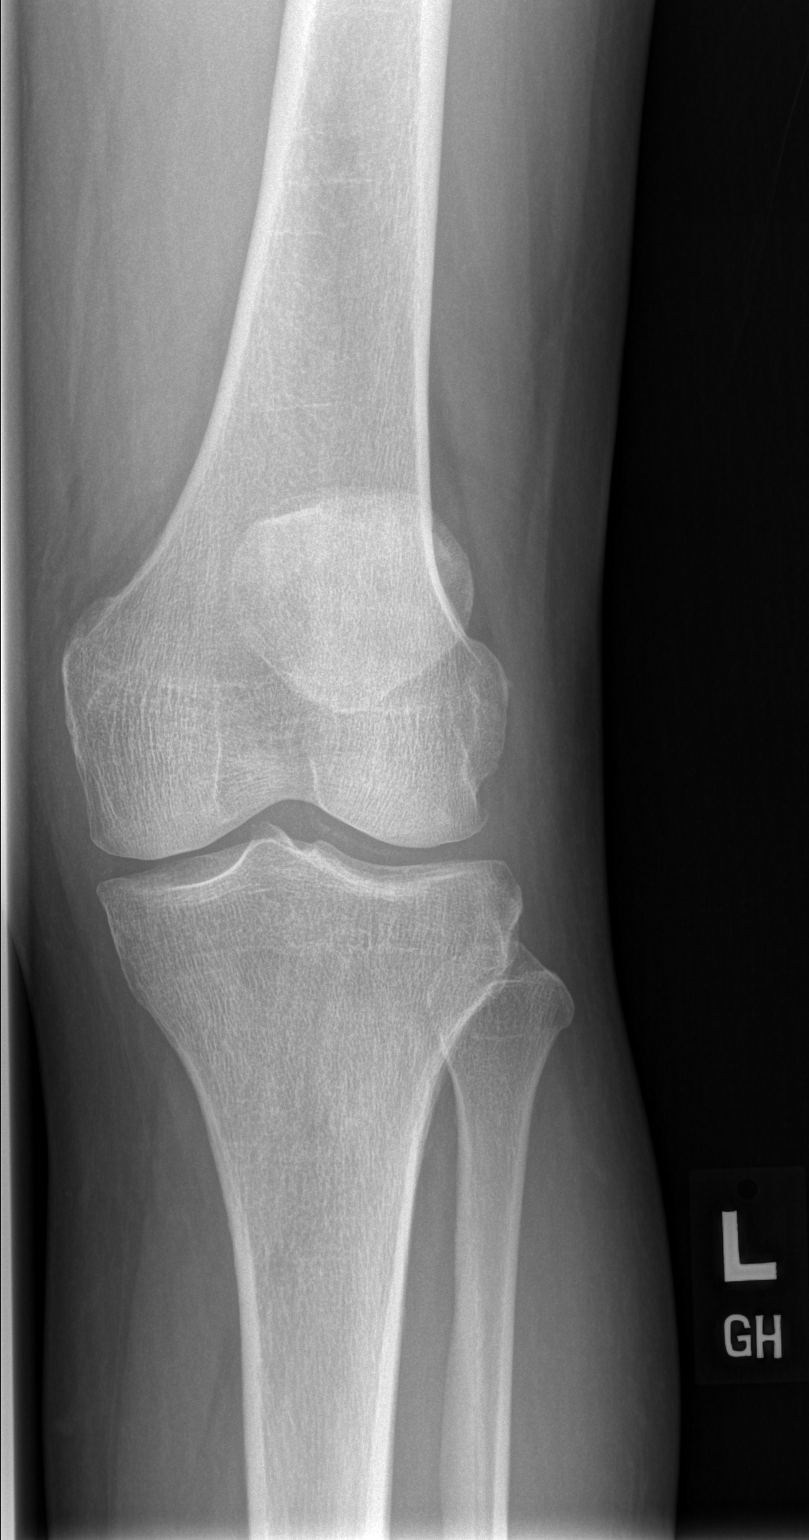

[w knee lat. left]
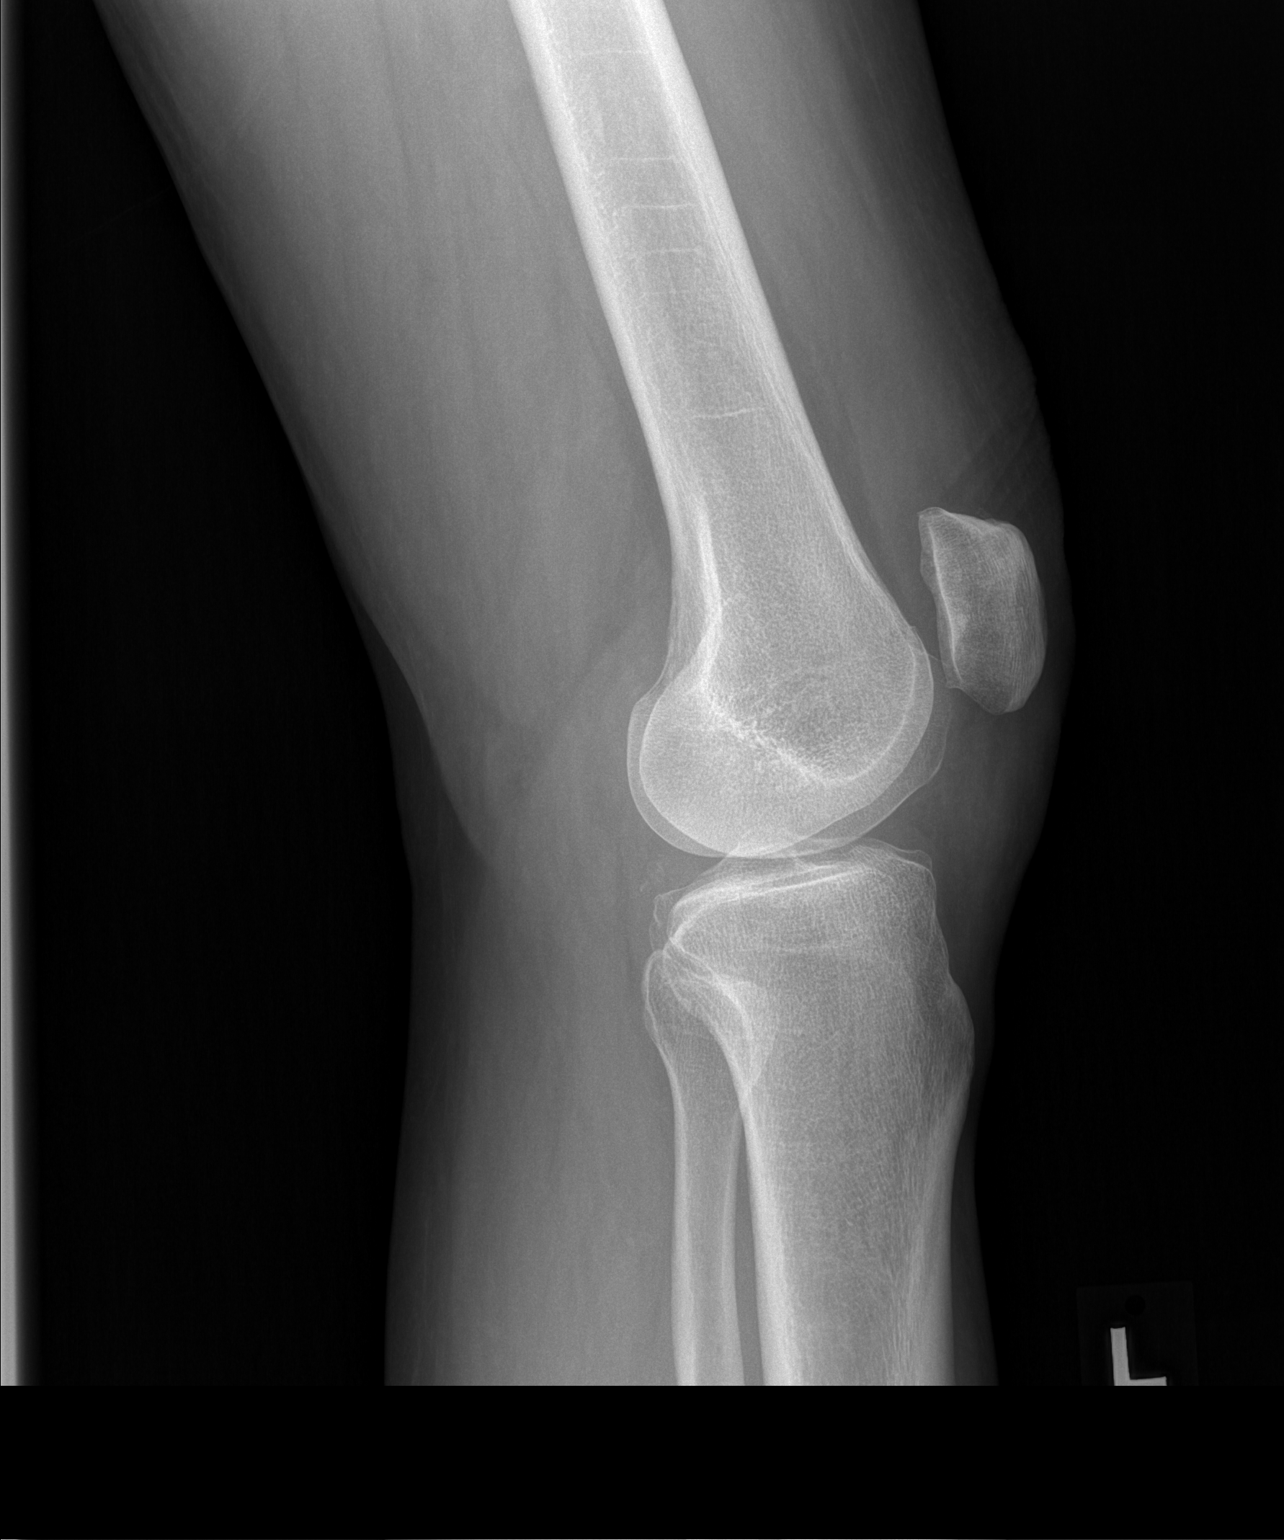

[2 of 2 positions shown; findings below may reference images not displayed]

FINDINGS: Normal alignment. Normal joint spaces. Minimal spurring of the
patellofemoral compartment. There is faint chondrocalcinosis. Small
knee joint effusion. No fracture, erosion, or focal bone
abnormality. Unremarkable soft tissues.
IMPRESSION: 1. Minimal osteoarthritis of the patellofemoral compartment.
2. Faint chondrocalcinosis.
3. Small knee joint effusion.

## 2023-06-07 IMAGING — CR DG KNEE 1-2V*R*
2 series · 2 of 2 positions shown · non-contrast
Comparison: None.

CLINICAL DATA: Chronic bilateral knee pain, left greater than
right. Question osteoarthritis.

EXAM:
RIGHT KNEE - 1-2 VIEW

[w knee ap right]
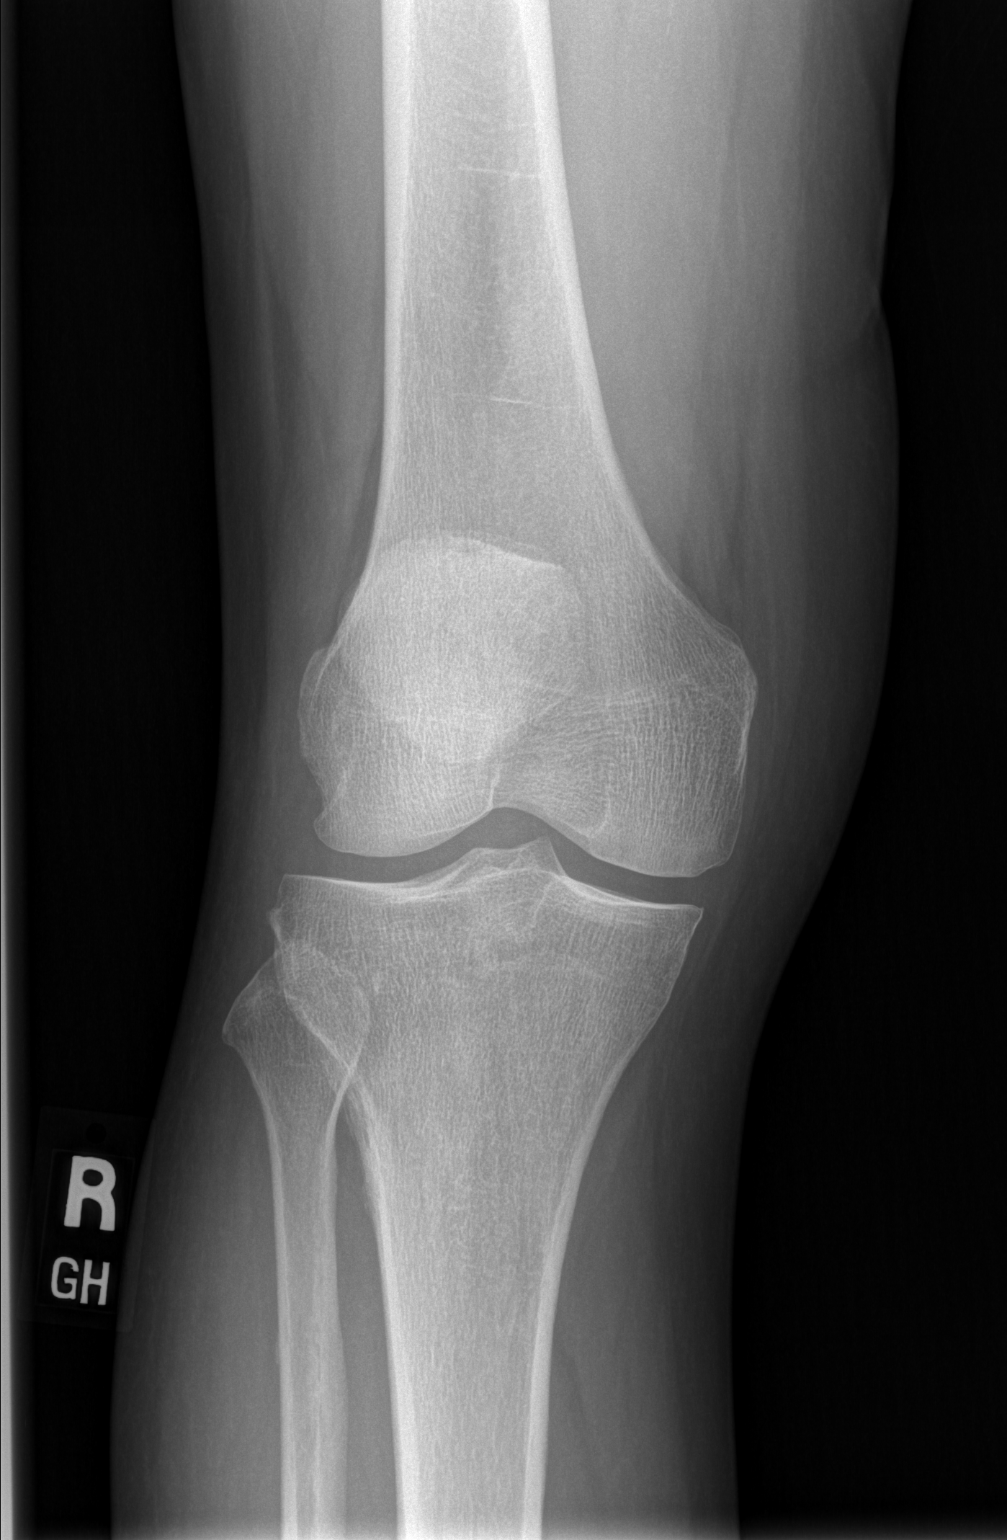

[w knee lat. right]
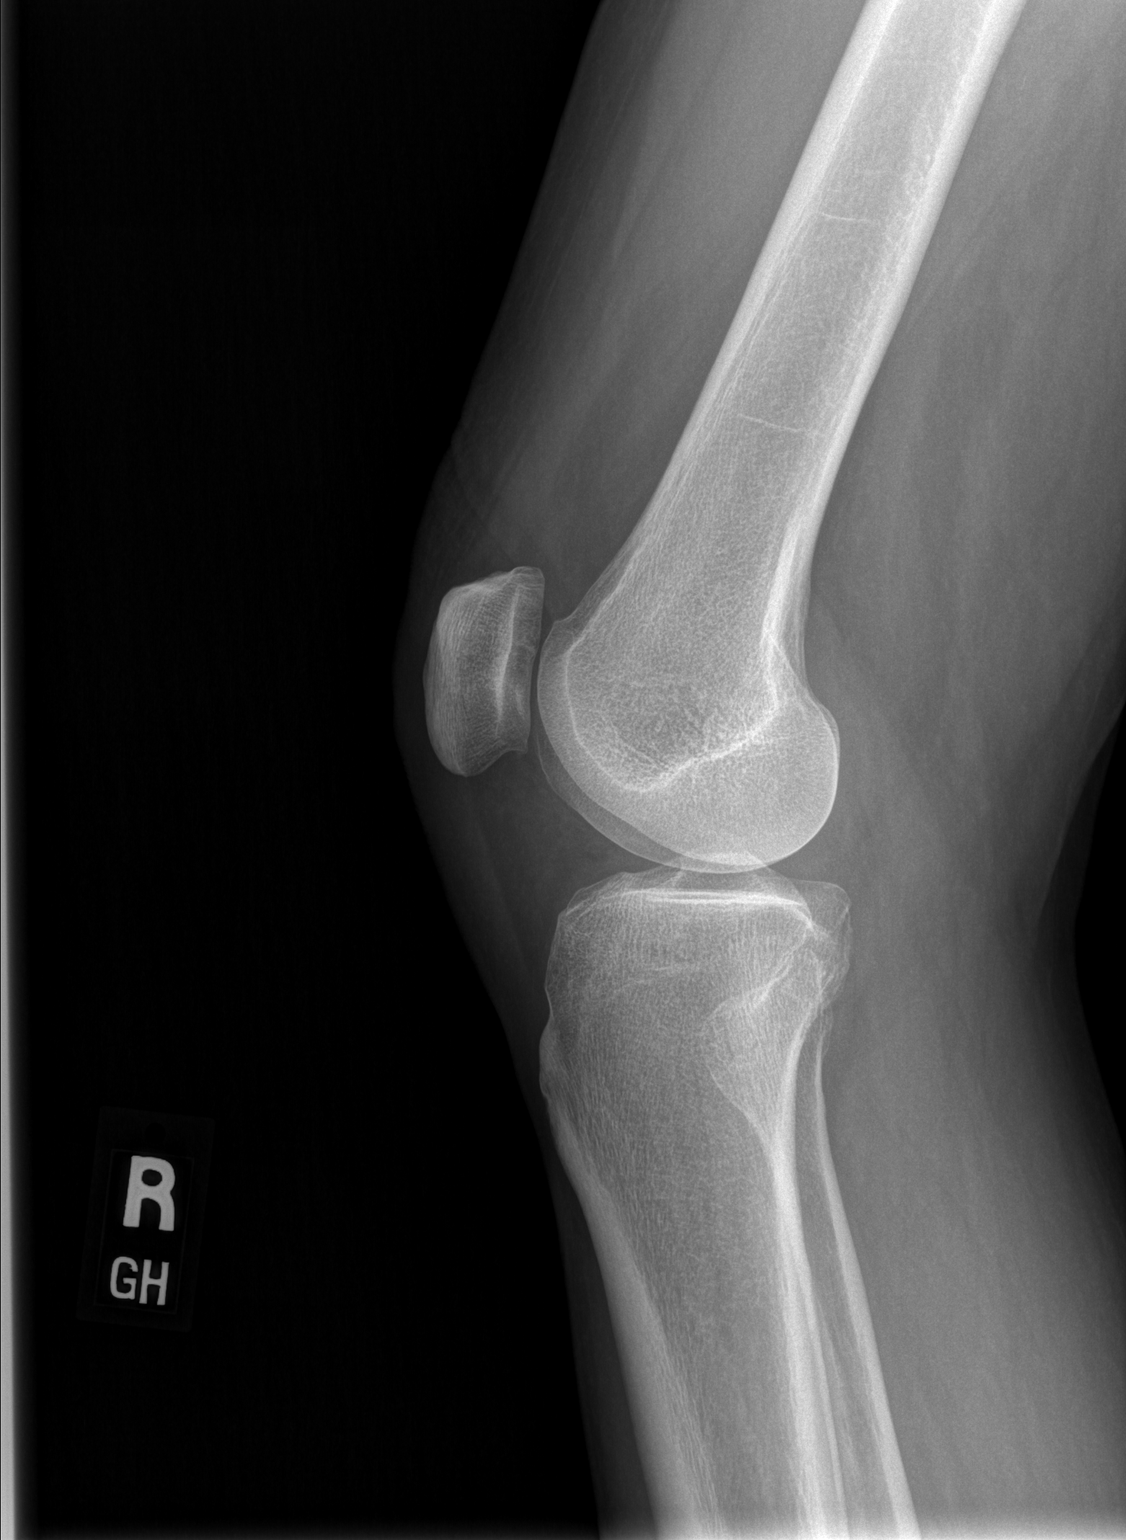

[2 of 2 positions shown; findings below may reference images not displayed]

FINDINGS: Normal alignment. Joint spaces are preserved. Tiny patellofemoral
osteophytes and possible subchondral cyst in the central patella. No
significant knee joint effusion. No fracture, erosion, or focal bone
abnormality. Unremarkable soft tissues.
IMPRESSION: Minimal patellofemoral osteoarthritis with possible subchondral cyst
in the central patella.

## 2023-06-20 ENCOUNTER — Other Ambulatory Visit (INDEPENDENT_AMBULATORY_CARE_PROVIDER_SITE_OTHER): Payer: Self-pay

## 2023-06-20 DIAGNOSIS — E1169 Type 2 diabetes mellitus with other specified complication: Secondary | ICD-10-CM

## 2023-06-20 DIAGNOSIS — E039 Hypothyroidism, unspecified: Secondary | ICD-10-CM

## 2023-06-20 MED ORDER — LEVOTHYROXINE SODIUM 75 MCG PO TABS
75.0000 ug | ORAL_TABLET | Freq: Every morning | ORAL | 3 refills | Status: AC
Start: 2023-06-20 — End: ?

## 2023-06-20 MED ORDER — METFORMIN HCL 1000 MG PO TABS
1000.0000 mg | ORAL_TABLET | Freq: Two times a day (BID) | ORAL | 3 refills | Status: AC
Start: 2023-06-20 — End: ?

## 2023-06-20 NOTE — Telephone Encounter (Signed)
 LOV 03/2023  Please advsise

## 2023-06-21 ENCOUNTER — Encounter (INDEPENDENT_AMBULATORY_CARE_PROVIDER_SITE_OTHER): Payer: Self-pay

## 2023-06-27 ENCOUNTER — Encounter (INDEPENDENT_AMBULATORY_CARE_PROVIDER_SITE_OTHER): Payer: Self-pay

## 2023-07-25 ENCOUNTER — Encounter (INDEPENDENT_AMBULATORY_CARE_PROVIDER_SITE_OTHER): Payer: Self-pay

## 2023-07-26 ENCOUNTER — Ambulatory Visit (INDEPENDENT_AMBULATORY_CARE_PROVIDER_SITE_OTHER): Payer: Medicare Other | Admitting: Internal Medicine

## 2023-07-31 ENCOUNTER — Encounter (INDEPENDENT_AMBULATORY_CARE_PROVIDER_SITE_OTHER): Payer: Self-pay | Admitting: Internal Medicine

## 2023-07-31 ENCOUNTER — Ambulatory Visit (INDEPENDENT_AMBULATORY_CARE_PROVIDER_SITE_OTHER): Payer: Self-pay | Admitting: Internal Medicine

## 2023-07-31 ENCOUNTER — Ambulatory Visit (INDEPENDENT_AMBULATORY_CARE_PROVIDER_SITE_OTHER): Admitting: Internal Medicine

## 2023-07-31 DIAGNOSIS — E1169 Type 2 diabetes mellitus with other specified complication: Secondary | ICD-10-CM

## 2023-07-31 DIAGNOSIS — I10 Essential (primary) hypertension: Secondary | ICD-10-CM

## 2023-07-31 DIAGNOSIS — E039 Hypothyroidism, unspecified: Secondary | ICD-10-CM

## 2023-07-31 DIAGNOSIS — E782 Mixed hyperlipidemia: Secondary | ICD-10-CM

## 2023-07-31 DIAGNOSIS — E785 Hyperlipidemia, unspecified: Secondary | ICD-10-CM

## 2023-07-31 LAB — HEMOGLOBIN A1C
Average Estimated Glucose: 148.5 mg/dL
Hemoglobin A1C: 6.8 % — ABNORMAL HIGH (ref 4.6–5.6)

## 2023-07-31 MED ORDER — LEVOTHYROXINE SODIUM 75 MCG PO TABS: 75.0000 ug | ORAL_TABLET | Freq: Every morning | ORAL | 3 refills | Status: DC

## 2023-07-31 MED ORDER — LOSARTAN POTASSIUM 50 MG PO TABS
50.0000 mg | ORAL_TABLET | Freq: Every day | ORAL | 3 refills | Status: AC
Start: 2023-07-31 — End: ?

## 2023-07-31 MED ORDER — METFORMIN HCL 1000 MG PO TABS
1000.0000 mg | ORAL_TABLET | Freq: Two times a day (BID) | ORAL | 3 refills | Status: AC
Start: 2023-07-31 — End: ?

## 2023-07-31 MED ORDER — GLUCOSE BLOOD VI STRP
1.0000 | ORAL_STRIP | Freq: Every day | 3 refills | Status: AC
Start: 2023-07-31 — End: ?

## 2023-07-31 MED ORDER — ROSUVASTATIN CALCIUM 5 MG PO TABS
5.0000 mg | ORAL_TABLET | Freq: Every day | ORAL | 3 refills | Status: DC
Start: 2023-07-31 — End: 2023-09-06

## 2023-07-31 NOTE — Progress Notes (Signed)
 [facility] PRIMARY CARE  [place]  PROGRESS NOTE      Patient: Jason Trujillo   Date: 07/31/2023   MRN: [id number]     ASSESSMENT/PLAN     Jason Trujillo is a 78 y.o. male    Chief Complaint   Patient presents with   . Diabetes Follow-up     Not Fasting    . Hypertension   . Hyperlipidemia        1. Type 2 diabetes mellitus with hyperlipidemia (CMS/HCC)  Your treatment plan for DM:    A. Continue these medications: Continue Jardiance 10 mg daily, metformin  1 g twice a day.   Patient has an occasional pain in the calf area upon exertion.  He had a peripheral arterial Doppler study in 2020 which was normal.  At 1 time patient needs to take aspirin.  I recommend patient to restart aspirin 81 mg once a day.  Side effects including GI bleeding ulcers and acidity are explained.  He did not have any problems in the past.  B: Lifestyle changes - Choices you make every day about the foods you eat and the way you live can have a big impact on your general health.   Here are some thingsyou can do to reduce your health risks:  .Make healthy food choices - Eat lots of fruits, vegetables, whole grains, and low-fat dairy products. Limit the amount of meat and fried or fatty foods.  .Be active - Walk, garden, or do something active for 30 minutes or more on most days of the week.  SABRAAvoid smoking - Smoking increases the chance that you will have a heart attackor stroke, or develop cancer.  .Lose weight - Being overweight increases the risk of many health problems.  .Avoid alcohol - Alcohol can increase blood sugar and blood pressure.    C: Prevention of complications:      Get a dilated eye examination once a year.      Get A1c blood test and urine microalbumin test at least once a year.      Get diabetic feet care       Check your blood sugars as recommended and bring blood sugar records at next visit.    - glucose blood test strip; 1 each by Other route once daily Use as instructed  Dispense: 100 each; Refill: 3  - metFORMIN   (GLUCOPHAGE ) 1000 MG tablet; Take 1 tablet (1,000 mg) by mouth 2 (two) times daily with meals  Dispense: 180 tablet; Refill: 3  - Hemoglobin A1C; Future    2. Mixed hyperlipidemia  Patient on rosuvastatin  5 mg daily.  Tolerating it well.  Follow low-carb low-fat diet and exercise regularly.    - rosuvastatin  (CRESTOR ) 5 MG tablet; Take 1 tablet (5 mg) by mouth once daily  Dispense: 90 tablet; Refill: 3    3. Primary hypertension  Blood pressure is controlled.  Continue losartan  50 mg daily..    Our goal is to reduce blood pressure below 130/80.  I suggest  ?Lose weight (if you are overweight)  ?Choose a diet low in fat and rich in fruits, vegetables, and low-fat dairyproducts  ?Reduce the amount of salt you eat  ?Do something active for at least 30 minutes a day on most days of the week  ?Reduce how much alcohol you drink (if you drink more than 2 alcoholic drinksper day)    It's also a good idea to get a home blood pressure meter. People who check their own blood pressure  at home do better at keeping it low and can sometimeseven reduce the amount of medicine they take.    - losartan  (COZAAR ) 50 MG tablet; Take 1 tablet (50 mg) by mouth once daily  Dispense: 90 tablet; Refill: 3    4. Acquired hypothyroidism  Refill as follows.  - levothyroxine  (SYNTHROID ) 75 MCG tablet; Take 1 tablet (75 mcg) by mouth once every morning  Dispense: 90 tablet; Refill: 3           MEDICATIONS     Current Medications[1]    Allergies[2]    SUBJECTIVE     Chief Complaint   Patient presents with   . Diabetes Follow-up     Not Fasting    . Hypertension   . Hyperlipidemia        Diabetes Follow-up  The patient is being seen for routine monitoring of diabetes. Diabetes is classified as type II and non-insulin requiring.   There are no known complications to diabetes.  Comorbid illnesses include hyperlipidemia and hypertension.  There are no recent interval events.    Patient denies hypoglycemia, polyphagia, polyuria and weakness. Current  therapy includes metformin  and SGLT2 inhibitor.  Patient is compliant with the current regimen.  He monitors blood glucose at home 1-2 x per day.   The patient's prior A1c result is between 7 to 8.  Lifestyle changes have included improved nutrition.      Hypertension  The patient is being seen for a routine follow-up of hypertension. Hypertension is classified as essential. There is no history of CHF. Comorbid illnesses include hyperlipidemia and diabetes. There are no recent interval events.  There is no interval history of chest pain and lower extremity edema.  Current therapy includes ARBs. Patient is compliant with the current regimen.   The patient's blood pressure response to medications is at goal.    Hyperlipidemia  The patient is being seen for a routine follow-up of hyperlipidemia.  Hyperlipidemia is classified as combined hyperlipidemia. Comorbid illnesses include hypertension, diabetes and hypothyroidism.  There are no recent interval events.  There is no interval history of myalgias, dyspnea and irregular heart beat. Current therapy includes a statin and lifestyle changes.  Patient is compliant with the current regimen. The LDL is at goal.    Trace edema on the ankle and foot        ROS     Review of Systems   Constitutional:  Negative for fever and unexpected weight change.   Respiratory:  Negative for shortness of breath.    Cardiovascular:  Negative for chest pain, palpitations and leg swelling.   Musculoskeletal:  Positive for arthralgias and gait problem. Negative for back pain.   Neurological:  Negative for dizziness, light-headedness and headaches.     Problem List[3]    Medical History[4]        The following portions of the patient's history were reviewed and updated as appropriate: Allergies, Current Medications, Past Family History, Past Medical history, Past social history, Past surgical history, and Problem List.      PHYSICAL EXAM     Vitals:    07/31/23 1459   BP: 129/64   BP Site: Left  arm   Patient Position: Sitting   Cuff Size: Medium   Pulse: 65   Temp: 97.5 F (36.4 C)   TempSrc: Oral   SpO2: 100%   Weight: 60.6 kg (133 lb 9.6 oz)   Height: 1.59 m (5' 2.6)  Physical Exam  Constitutional:       Appearance: Normal appearance.   Cardiovascular:      Rate and Rhythm: Normal rate and regular rhythm.      Pulses: Normal pulses.      Heart sounds: Normal heart sounds.   Pulmonary:      Effort: Pulmonary effort is normal.      Breath sounds: Normal breath sounds. No wheezing, rhonchi or rales.   Musculoskeletal:      Cervical back: Normal range of motion and neck supple.      Right lower leg: Edema (Trace edema on ankle and foot) present.      Left lower leg: Edema present.   Neurological:      Mental Status: He is alert.           Signed,  Eleanore KANDICE Blanch, MD, MD  07/31/2023           [1]  Current Outpatient Medications   Medication Sig Dispense Refill   . aflibercept (Eylea) 2 MG/0.05ML Solution      . Blood Glucose Calibration (Accu-Chek Aviva) Solution      . Cholecalciferol (Vitamin D) 10 MCG/ML Liquid      . empagliflozin (Jardiance) 10 MG tablet Take 1 tablet (10 mg) by mouth     . Multiple Vitamins-Minerals (CENTRUM ADULT 50+ MULTIGUMMIES PO)      . glucose blood test strip 1 each by Other route once daily Use as instructed 100 each 3   . levothyroxine  (SYNTHROID ) 75 MCG tablet Take 1 tablet (75 mcg) by mouth once every morning 90 tablet 3   . losartan  (COZAAR ) 50 MG tablet Take 1 tablet (50 mg) by mouth once daily 90 tablet 3   . metFORMIN  (GLUCOPHAGE ) 1000 MG tablet Take 1 tablet (1,000 mg) by mouth 2 (two) times daily with meals 180 tablet 3   . rosuvastatin  (CRESTOR ) 5 MG tablet Take 1 tablet (5 mg) by mouth once daily 90 tablet 3     No current facility-administered medications for this visit.   [2]  No Known Allergies  [3]  Patient Active Problem List  Diagnosis   . Type 2 diabetes mellitus with hyperlipidemia (CMS/HCC)   . Iron deficiency anemia, unspecified iron deficiency  anemia type   . B12 deficiency   . Acquired hypothyroidism   . Arthritis of knee, left   . Arthritis of knee, right   . Stable proliferative diabetic retinopathy of left eye associated with type 2 diabetes mellitus (CMS/HCC)   [4]  Past Medical History:  Diagnosis Date   . Arthritis     Knees   . Diabetes mellitus (CMS/HCC) 1995   . Disorder of thyroid 2021   . Hyperlipidemia    . Hypertension    . Macular degeneration of left eye     injection every 3 momths RGW

## 2023-07-31 NOTE — Progress Notes (Signed)
 Have you seen any specialists since your last visit with us ?  No      The patient was informed that the following HM items are still outstanding:   Health Maintenance Due   Topic Date Due    Advance Directive on File  Never done    OPHTHALMOLOGY EXAM  Never done    Shingrix Vaccine 50+ (1) Never done    Tetanus Ten-Year  Never done    HEPATITIS C SCREENING  Never done    Pneumonia Vaccine Age 79 Years and Older (1 of 1 - PCV) Never done    COVID-19 Vaccine (1 - 2024-25 season) Never done

## 2023-09-06 ENCOUNTER — Encounter (INDEPENDENT_AMBULATORY_CARE_PROVIDER_SITE_OTHER): Payer: Self-pay | Admitting: Internal Medicine

## 2023-09-06 ENCOUNTER — Other Ambulatory Visit (INDEPENDENT_AMBULATORY_CARE_PROVIDER_SITE_OTHER): Payer: Self-pay | Admitting: Internal Medicine

## 2023-09-06 DIAGNOSIS — E782 Mixed hyperlipidemia: Secondary | ICD-10-CM

## 2023-09-06 MED ORDER — ATORVASTATIN CALCIUM 20 MG PO TABS
20.0000 mg | ORAL_TABLET | Freq: Every day | ORAL | 3 refills | Status: AC
Start: 2023-09-06 — End: ?

## 2023-10-22 ENCOUNTER — Encounter (INDEPENDENT_AMBULATORY_CARE_PROVIDER_SITE_OTHER): Payer: Self-pay | Admitting: Internal Medicine

## 2023-11-19 ENCOUNTER — Ambulatory Visit (INDEPENDENT_AMBULATORY_CARE_PROVIDER_SITE_OTHER): Admitting: Internal Medicine

## 2023-11-19 ENCOUNTER — Telehealth (INDEPENDENT_AMBULATORY_CARE_PROVIDER_SITE_OTHER): Payer: Self-pay | Admitting: Internal Medicine

## 2023-11-19 ENCOUNTER — Encounter (INDEPENDENT_AMBULATORY_CARE_PROVIDER_SITE_OTHER): Payer: Self-pay | Admitting: Internal Medicine

## 2023-11-19 ENCOUNTER — Other Ambulatory Visit (INDEPENDENT_AMBULATORY_CARE_PROVIDER_SITE_OTHER): Payer: Self-pay | Admitting: Internal Medicine

## 2023-11-19 VITALS — BP 143/70 | HR 69 | Temp 97.7°F | Ht 63.78 in | Wt 132.2 lb

## 2023-11-19 DIAGNOSIS — E782 Mixed hyperlipidemia: Secondary | ICD-10-CM

## 2023-11-19 DIAGNOSIS — E785 Hyperlipidemia, unspecified: Secondary | ICD-10-CM

## 2023-11-19 DIAGNOSIS — E039 Hypothyroidism, unspecified: Secondary | ICD-10-CM

## 2023-11-19 DIAGNOSIS — B880 Other acariasis: Secondary | ICD-10-CM

## 2023-11-19 DIAGNOSIS — I10 Essential (primary) hypertension: Secondary | ICD-10-CM

## 2023-11-19 DIAGNOSIS — Z125 Encounter for screening for malignant neoplasm of prostate: Secondary | ICD-10-CM

## 2023-11-19 MED ORDER — TRIAMCINOLONE ACETONIDE 0.5 % EX OINT
TOPICAL_OINTMENT | Freq: Two times a day (BID) | CUTANEOUS | 0 refills | Status: AC
Start: 2023-11-19 — End: ?

## 2023-11-19 MED ORDER — DOXYCYCLINE HYCLATE 100 MG PO CAPS
100.0000 mg | ORAL_CAPSULE | Freq: Two times a day (BID) | ORAL | 0 refills | Status: AC
Start: 2023-11-19 — End: 2023-11-26

## 2023-11-19 NOTE — Telephone Encounter (Signed)
 Pt will have his MAW in November 11 so he is requesting if you can place a lab order so he can do that early meaning days before his actual MAW. Thanks!

## 2023-11-19 NOTE — Telephone Encounter (Signed)
 I have ordered labs to be done in October.

## 2023-11-19 NOTE — Progress Notes (Signed)
 [facility] PRIMARY CARE  [place]  PROGRESS NOTE      Patient: Jason Trujillo   Date: 11/19/2023   MRN: [id number]     ASSESSMENT/PLAN     Jason Trujillo is a 78 y.o. male    Chief Complaint   Patient presents with    Rash     On leg        1. Chigger bites (Primary)  He noted a red bump on the right upper inner thigh area 4 days ago which was raised red and itchy.  He normally walks outside and also works in the garden.  2 spots initially noted on the right thigh.  A third spot noted in the right ankle area 2 days ago.  No fever or shortness of breath.  Most likely a chigger bite.  Advised following medications.  If not better in a week or so, let us  know  - doxycycline  (VIBRAMYCIN ) 100 MG capsule; Take 1 capsule (100 mg) by mouth 2 (two) times daily for 7 days  Dispense: 14 capsule; Refill: 0  - triamcinolone  (KENALOG ) 0.5 % ointment; Apply topically 2 (two) times daily  Dispense: 30 g; Refill: 0           MEDICATIONS     Current Medications[1]    Allergies[2]    SUBJECTIVE     Chief Complaint   Patient presents with    Rash     On leg        Rash  This is a new problem. The current episode started in the past 7 days. The problem has been rapidly worsening since onset. Location: Right thigh and ankle area. The rash is characterized by burning, itchiness, redness and swelling. It is unknown if there was an exposure to a precipitant. Pertinent negatives include no fatigue or fever. Past treatments include anti-itch cream. The treatment provided no relief.           ROS     Review of Systems   Constitutional:  Negative for fatigue and fever.   Skin:  Positive for rash.       Problem List[3]    Medical History[4]        The following portions of the patient's history were reviewed and updated as appropriate: Allergies, Current Medications, Past Family History, Past Medical history, Past social history, Past surgical history, and Problem List.      PHYSICAL EXAM     Vitals:    11/19/23 0922   BP: 143/70   BP Site: Left arm    Patient Position: Sitting   Cuff Size: Medium   Pulse: 69   Temp: 97.7 F (36.5 C)   TempSrc: Temporal   SpO2: 100%   Weight: 60 kg (132 lb 3.2 oz)   Height: 1.62 m (5' 3.78)         Physical Exam  Constitutional:       Appearance: Normal appearance.   Cardiovascular:      Rate and Rhythm: Normal rate and regular rhythm.   Pulmonary:      Effort: Pulmonary effort is normal.      Breath sounds: Normal breath sounds.   Skin:     Findings: Rash present. Rash is papular and pustular.        Neurological:      Mental Status: He is alert.               Signed,  Eleanore KANDICE Blanch, MD, MD  11/19/2023         [  1]   Current Outpatient Medications   Medication Sig Dispense Refill    aflibercept (Eylea) 2 MG/0.05ML Solution       atorvastatin  (LIPITOR) 20 MG tablet Take 1 tablet (20 mg) by mouth once daily 90 tablet 3    Blood Glucose Calibration (Accu-Chek Aviva) Solution       Cholecalciferol (Vitamin D) 10 MCG/ML Liquid       empagliflozin (Jardiance) 10 MG tablet Take 1 tablet (10 mg) by mouth      glucose blood test strip 1 each by Other route once daily Use as instructed 100 each 3    levothyroxine  (SYNTHROID ) 75 MCG tablet Take 1 tablet (75 mcg) by mouth once every morning 90 tablet 3    losartan  (COZAAR ) 50 MG tablet Take 1 tablet (50 mg) by mouth once daily 90 tablet 3    metFORMIN  (GLUCOPHAGE ) 1000 MG tablet Take 1 tablet (1,000 mg) by mouth 2 (two) times daily with meals 180 tablet 3    Multiple Vitamins-Minerals (CENTRUM ADULT 50+ MULTIGUMMIES PO)       doxycycline  (VIBRAMYCIN ) 100 MG capsule Take 1 capsule (100 mg) by mouth 2 (two) times daily for 7 days 14 capsule 0    triamcinolone  (KENALOG ) 0.5 % ointment Apply topically 2 (two) times daily 30 g 0     No current facility-administered medications for this visit.   [2] No Known Allergies  [3]   Patient Active Problem List  Diagnosis    Type 2 diabetes mellitus with hyperlipidemia (CMS/HCC)    Iron deficiency anemia, unspecified iron deficiency anemia type    B12  deficiency    Acquired hypothyroidism    Arthritis of knee, left    Arthritis of knee, right    Stable proliferative diabetic retinopathy of left eye associated with type 2 diabetes mellitus (CMS/HCC)   [4]   Past Medical History:  Diagnosis Date    Arthritis     Knees    Diabetes mellitus (CMS/HCC) 1995    Disorder of thyroid 2021    Hyperlipidemia     Hypertension     Macular degeneration of left eye     injection every 3 momths RGW

## 2023-11-19 NOTE — Progress Notes (Signed)
 Have you seen any specialists since your last visit with us ?  No      The patient was informed that the following HM items are still outstanding:   Health Maintenance Due   Topic Date Due    Advance Directive on File  Never done    OPHTHALMOLOGY EXAM  Never done    Shingrix Vaccine 50+ (1) Never done    Tetanus Ten-Year  Never done    HEPATITIS C SCREENING  Never done    Pneumonia Vaccine Age 79 Years and Older (1 of 1 - PCV) Never done    Influenza Vaccine  10/11/2023    DEPRESSION SCREENING  10/15/2023    COVID-19 Vaccine (1 - 2024-25 season) Never done

## 2023-12-24 ENCOUNTER — Other Ambulatory Visit (INDEPENDENT_AMBULATORY_CARE_PROVIDER_SITE_OTHER): Payer: Self-pay | Admitting: Internal Medicine

## 2023-12-31 ENCOUNTER — Other Ambulatory Visit (INDEPENDENT_AMBULATORY_CARE_PROVIDER_SITE_OTHER): Payer: Self-pay | Admitting: Internal Medicine

## 2024-01-06 ENCOUNTER — Other Ambulatory Visit

## 2024-01-06 ENCOUNTER — Ambulatory Visit (INDEPENDENT_AMBULATORY_CARE_PROVIDER_SITE_OTHER): Payer: Self-pay | Admitting: Internal Medicine

## 2024-01-06 DIAGNOSIS — Z125 Encounter for screening for malignant neoplasm of prostate: Secondary | ICD-10-CM

## 2024-01-06 DIAGNOSIS — E782 Mixed hyperlipidemia: Secondary | ICD-10-CM

## 2024-01-06 DIAGNOSIS — I10 Essential (primary) hypertension: Secondary | ICD-10-CM

## 2024-01-06 DIAGNOSIS — E785 Hyperlipidemia, unspecified: Secondary | ICD-10-CM

## 2024-01-06 DIAGNOSIS — E1169 Type 2 diabetes mellitus with other specified complication: Secondary | ICD-10-CM

## 2024-01-06 DIAGNOSIS — E039 Hypothyroidism, unspecified: Secondary | ICD-10-CM

## 2024-01-06 LAB — LAB USE ONLY - CBC WITH DIFFERENTIAL
Absolute Basophils: 0.01 x10 3/uL (ref 0.00–0.08)
Absolute Eosinophils: 0.19 x10 3/uL (ref 0.00–0.44)
Absolute Immature Granulocytes: 0.01 x10 3/uL (ref 0.00–0.07)
Absolute Lymphocytes: 2.77 x10 3/uL (ref 0.42–3.22)
Absolute Monocytes: 0.6 x10 3/uL (ref 0.21–0.85)
Absolute Neutrophils: 2.57 x10 3/uL (ref 1.10–6.33)
Absolute nRBC: 0 x10 3/uL (ref <=0.00)
Basophils %: 0.2 %
Eosinophils %: 3.1 %
Hematocrit: 38.2 % (ref 37.6–49.6)
Hemoglobin: 12.3 g/dL — ABNORMAL LOW (ref 12.5–17.1)
Immature Granulocytes %: 0.2 %
Lymphocytes %: 45 %
MCH: 29.8 pg (ref 25.1–33.5)
MCHC: 32.2 g/dL (ref 31.5–35.8)
MCV: 92.5 fL (ref 78.0–96.0)
MPV: 10.1 fL (ref 8.9–12.5)
Monocytes %: 9.8 %
Neutrophils %: 41.7 %
Platelet Count: 253 x10 3/uL (ref 142–346)
Preliminary Absolute Neutrophil Count: 2.57 x10 3/uL (ref 1.10–6.33)
RBC: 4.13 x10 6/uL — ABNORMAL LOW (ref 4.20–5.90)
RDW: 14 % (ref 11–15)
WBC: 6.15 x10 3/uL (ref 3.10–9.50)
nRBC %: 0 /100{WBCs} (ref <=0.0)

## 2024-01-06 LAB — COMPREHENSIVE METABOLIC PANEL
ALT: 16 U/L (ref <=55)
AST (SGOT): 28 U/L (ref <=41)
Albumin/Globulin Ratio: 2.2 (ref 0.9–2.2)
Albumin: 4.4 g/dL (ref 3.8–5.0)
Alkaline Phosphatase: 57 U/L (ref 37–117)
Anion Gap: 7 (ref 5.0–15.0)
BUN: 28 mg/dL (ref 9–28)
Bilirubin, Total: 0.7 mg/dL (ref 0.2–1.2)
CO2: 25 meq/L (ref 17–29)
Calcium: 9.2 mg/dL (ref 7.9–10.2)
Chloride: 104 meq/L (ref 99–111)
Creatinine: 1.2 mg/dL (ref 0.5–1.5)
GFR: >60 mL/min/1.73 m2 (ref >=60.0)
Globulin: 2 g/dL (ref 2.0–3.6)
Glucose: 92 mg/dL (ref 70–100)
Hemolysis Index: 5 {index}
Potassium: 5 meq/L (ref 3.5–5.3)
Protein, Total: 6.4 g/dL (ref 6.0–8.3)
Sodium: 136 meq/L (ref 135–145)

## 2024-01-06 LAB — LIPID PANEL
Cholesterol / HDL Ratio: 2.2 {index}
Cholesterol: 131 mg/dL (ref <=199)
HDL: 60 mg/dL (ref >=40)
LDL Calculated: 61 mg/dL (ref 0–99)
Triglycerides: 40 mg/dL (ref 34–149)
VLDL Calculated: 6 mg/dL — ABNORMAL LOW (ref 10–40)

## 2024-01-06 LAB — HEMOGLOBIN A1C
Average Estimated Glucose: 151.3 mg/dL
Hemoglobin A1C: 6.9 % — ABNORMAL HIGH (ref 4.6–5.6)

## 2024-01-06 LAB — T4, FREE: T4 Free: 1.13 ng/dL (ref 0.69–1.48)

## 2024-01-06 LAB — URINE MICROALBUMIN, RANDOM
Urine Creatinine: 82 mg/dL
Urine Microalbumin/Creatinine Ratio: 43 ug/mg — ABNORMAL HIGH (ref <30)
Urine Microalbumin: 35 ug/mL — ABNORMAL HIGH (ref 0.0–30.0)

## 2024-01-06 LAB — PSA TOTAL, ANNUAL SCREENING: Prostate Specific Antigen, Total: 0.58 ng/mL (ref 0.000–4.000)

## 2024-01-06 LAB — TSH: TSH: 1.67 u[IU]/mL (ref 0.35–4.94)

## 2024-01-07 ENCOUNTER — Other Ambulatory Visit

## 2024-01-07 NOTE — Progress Notes (Signed)
 Results received and reviewed by patient.

## 2024-01-17 ENCOUNTER — Encounter (INDEPENDENT_AMBULATORY_CARE_PROVIDER_SITE_OTHER): Payer: Self-pay | Admitting: Internal Medicine

## 2024-01-21 ENCOUNTER — Encounter (INDEPENDENT_AMBULATORY_CARE_PROVIDER_SITE_OTHER): Payer: Self-pay | Admitting: Internal Medicine

## 2024-01-21 ENCOUNTER — Ambulatory Visit (INDEPENDENT_AMBULATORY_CARE_PROVIDER_SITE_OTHER): Admitting: Internal Medicine

## 2024-01-21 ENCOUNTER — Other Ambulatory Visit (INDEPENDENT_AMBULATORY_CARE_PROVIDER_SITE_OTHER): Payer: Self-pay | Admitting: Internal Medicine

## 2024-01-21 ENCOUNTER — Telehealth (INDEPENDENT_AMBULATORY_CARE_PROVIDER_SITE_OTHER): Payer: Self-pay | Admitting: Internal Medicine

## 2024-01-21 VITALS — BP 120/70 | HR 58 | Temp 97.7°F | Ht 63.39 in | Wt 131.4 lb

## 2024-01-21 DIAGNOSIS — E782 Mixed hyperlipidemia: Secondary | ICD-10-CM

## 2024-01-21 DIAGNOSIS — E113552 Type 2 diabetes mellitus with stable proliferative diabetic retinopathy, left eye: Secondary | ICD-10-CM

## 2024-01-21 DIAGNOSIS — Z23 Encounter for immunization: Secondary | ICD-10-CM

## 2024-01-21 DIAGNOSIS — R809 Proteinuria, unspecified: Secondary | ICD-10-CM

## 2024-01-21 DIAGNOSIS — E1142 Type 2 diabetes mellitus with diabetic polyneuropathy: Secondary | ICD-10-CM

## 2024-01-21 DIAGNOSIS — I10 Essential (primary) hypertension: Secondary | ICD-10-CM

## 2024-01-21 DIAGNOSIS — Z0000 Encounter for general adult medical examination without abnormal findings: Secondary | ICD-10-CM

## 2024-01-21 DIAGNOSIS — E039 Hypothyroidism, unspecified: Secondary | ICD-10-CM

## 2024-01-21 DIAGNOSIS — Z125 Encounter for screening for malignant neoplasm of prostate: Secondary | ICD-10-CM

## 2024-01-21 NOTE — Progress Notes (Signed)
 Have you seen any specialists since your last visit with us ?  No      The patient was informed that the following HM items are still outstanding:   Health Maintenance Due   Topic Date Due    Advance Directive on File  Never done    OPHTHALMOLOGY EXAM  Never done    Shingrix Vaccine 50+ (1) Never done    Tetanus Ten-Year  Never done    HEPATITIS C SCREENING  Never done    Pneumonia Vaccine Age 79 Years and Older (1 of 1 - PCV) Never done    COVID-19 Vaccine (1 - 2024-25 season) Never done

## 2024-01-21 NOTE — Telephone Encounter (Signed)
 Pt is requesting his Chronic fasting labs be placed now so that he can get the labs done elsewhere before his f/u w PCP 05/22/24      Please advise

## 2024-01-21 NOTE — Assessment & Plan Note (Signed)
 Followed up by the retinal specialist.

## 2024-01-21 NOTE — Patient Instructions (Signed)
 MEDICARE WELLNESS PERSONAL PREVENTION PLAN  As part of the Medicare Wellness portion of your visit today, we are providing you with this personalized preventative plan of care. The list below includes many common screening recommendations from the USPSTF ([place]  Preventive Services Task Force) but is not meant to be comprehensive. You may be eligible for other preventative services depending upon your personal risk factors.     Health Maintenance   Topic Date Due    Advance Directive on File  Never done    OPHTHALMOLOGY EXAM  Never done    Shingrix Vaccine 50+ (1) Never done    Tetanus Ten-Year  Never done    HEPATITIS C SCREENING  Never done    Pneumonia Vaccine Age 79 Years and Older (1 of 1 - PCV) Never done    COVID-19 Vaccine (2 - 2024-25 season) 07/20/2024    HEMOGLOBIN A1C ANNUAL  01/05/2025    URINE MICROALBUMIN  01/05/2025    FALLS RISK ANNUAL  01/16/2025    DEPRESSION SCREENING  01/20/2025    Medicare Annual Wellness Visit  01/20/2025    Influenza Vaccine  Completed     Health Maintenance Topics with due status: Overdue       Topic Date Due    Advance Directive on File Never done    OPHTHALMOLOGY EXAM Never done    Shingrix Vaccine 50+ Never done    Tetanus Ten-Year Never done    HEPATITIS C SCREENING Never done    Pneumonia Vaccine Age 18 Years and Older Never done      Immunization History   Administered Date(s) Administered    COVID-19 mRNA vaccine 12 years and above (MODERNA/SPIKEVAX) 50 mcg/0.5 mL 01/21/2024    Influenza trivalent 2025-2026 inactivated high-dose vaccine 65 yrs+ (FLUZONE HIGH DOSE) 0.5 mL 01/15/2024        Your major risk factors: Diabetes, Hypertension, Coronary Artery Disease (CAD), Osteoarthritis, and Falls Risk  Recommendations for improvement:  Elevated Cholesterol/Low Salt Diet - Optimize adherence with low cholesterol diet.  Here is a good reference website for tips on healthy eating and ways to lower your cholesterol -   (nocareers.gl)  Exercise - Engage in age appropriate exercise 150 minutes per week   Hypertension - Monitor your blood pressure daily. Here is a good reference website for tips on managing hypertension - motivationalsites.no  Falls Prevention - Here is a good reference website for tips on avoiding falls - bikingrewards.pl  Compliance with prescription medications  Make sure that you follow-up with your specialists on a regular basis      Colorectal Cancer Screening - All adults 45-75 yrs should undergo periodic colorectal cancer screening. The decision to screen for colorectal cancer in adults aged 53 to 80 years should be an individual one, taking into account your overall health and prior screening history.   Prostate Cancer Screening - For men aged 18 to 83 years, the decision to undergo periodic prostate-specific antigen (PSA)-based screening for prostate cancer should be an individual one. Before deciding whether to be screened, men should have an opportunity to discuss the potential benefits and harms of screening with their clinician and to incorporate their values and preferences in the decision. Screening offers a small potential benefit of reducing the chance of death from prostate cancer in some men. However, many men will experience potential harms of screening, including false-positive results that require additional testing and possible prostate biopsy; overdiagnosis and overtreatment; and treatment complications, such as incontinence and erectile dysfunction. The USPSTF  recommends against PSA-based screening for prostate cancer in men 70 years and older.   Abdominal Aortic Aneurysm Screening - Men aged 50 to 79 years old who have EVER smoked should receive a one-time screening for abdominal aortic aneurysm with an ultrasound (there is not sufficient evidence to recommend for or  against this screening in similarly aged women)   Hepatitis C Screening - Recommend screening for hepatitis C virus (HCV) infection in all adults aged 60 to 50 years.  Lung cancer Screening - Recommend annual screening for lung cancer with low-dose computed tomography (LDCT) in adults ages 43 to 23 years who have a 20 pack-year smoking history and currently smoke or have quit within the past 15 years.  Recommended Vaccinations from the CDC ([place].S.  Centers for Disease Control and Prevention)   Influenza one dose annually   COVID vaccine - stay current with the most up-to-date COVID update/booster  Tetanus/diphtheria (Tdap) one booster every 10 years   Zoster/Shingles - (Shingrix) two doses after age 43 (second dose given 2-6 months after first dose)  Pneumococcal 20-valent or 21-valent conjugate vaccine - one dose for adults aged >=65 years with no history of prior pneumococcal vaccination.  Pneumococcal 20-valent or 21-valent conjugate vaccine - one dose for adults aged >=65 years with PPSV23 only or PCV13 only given more than 1 yr ago.  Pneumococcal 20-valent or 21-valent conjugate vaccine - one dose for adults aged >=65 years who have completed PCV-13 and PPSV23 after 79yrs old and it has been greater than 21yrs (shared clinical decision-making is recommended regarding administration of this vaccine).   RSV (Respiratory Syncytial Virus) vaccine for everyone ages 58 and older  RSV (Respiratory Syncytial Virus)  vaccine ages 62-74 who are at increased risk of severe RSV disease (for example, chronic heart/lung/liver/kidney disease, immunocompromised)    PERSONAL PREVENTION PLAN   Your Personal Prevention Plan is based on your overall health and your responses to the health questionnaire you completed. The following information is for you to review in addition to the recommendations, referrals, and tests we have discussed at your visit.     Physical Activity:   Physical activity can help you maintain a healthy weight,  prevent or control illness, reduce stress, and sleep better. It can also help you improve your balance to avoid falls. Try to build up to and maintain a total of 30 minutes of activity each day. If you are able, try walking, doing yard or housework, and taking the stairs more often. You can also strengthen your muscles with exercises done while sitting or lying down. Web resource - https://www.carpenter-henry.info/  Emotional Health:   Feeling "down in the dumps" or anxious every now and then is a natural part of life. If this feeling lasts for a few weeks or more, talk with me as soon as possible. It could be a sign of a problem that needs treatment. There are many types of treatment available. Web resource -  rxpreview.de  Falls:   You can reduce your risk of falling by making changes in your home. Remove items that may cause tripping, improve lighting, and consider installing grab bars.   Talk with me if you have problems with balance and walking. To prevent falls, you may need your vision, hearing, or blood pressure checked. Exercises to improve your strength and balance, or using a cane or walker, may help. Review your medicines with me at every visit, because some can affect balance. Please be sure to let me know if  you fall or are fearful you may fall. Web resource - bouncethru.fi  Urinary Leakage:   Urine leakage is common, but it is not a normal part of aging. Talk with me about any urine leakage so that the cause can be found and treated. Treatment can include bladder training, exercises, medicine or surgery.   Pain:   We all have aches and pains at times, but chronic pain can change how you feel and live every day. Please talk with me about any symptoms of chronic pain so that we can determine how best to treat.   Sleep:   Getting a good night's sleep is vital to your health and well-being and can help prevent or manage health problems. Often,  sleep can be improved by changing behaviors, including when you go to bed and what you do before bed. Sleep apnea can cause problems such as struggling to stay awake during the day. Please let me know if you would like to learn more about improving your sleep and/or think you may have sleep apnea. Web resource -  thousandquestions.com.cy  Seat Belt:   Please remember to wear a seat belt when driving or riding in a vehicle. It is one of the most important things you can do to stay safe in a car.   Nutrition:   Remember to eat plenty of fruits, vegetables, whole grains, and dairy. Drink at least 64 ounces (8 full glasses) of water a day, unless you have been advised to limit fluids.  Web resource - pickseat.dk  Alcohol:   Alcohol can have a greater effect on older people, who may feel its effects at a lower amount. Older people should limit alcoholic drinks (no more than one a day for women and no more than two a day for men). Please let me know if alcohol use becomes a problem.  Web resource -agingmortgage.ca  Tobacco:   Not smoking or using other forms of tobacco is one of the most important things you can do for your health. Here is some more information about the importance of quitting smoking and how to quit smoking - Mudlogger -  broadjournal.com.pt  Advance Directives:   There may come a time when medical decisions need to be made on your behalf. Please talk with your family, and with me, about your wishes. It is important to provide information about your decisions, and any formal advance directives, for your medical record. Here is additional information on advanced directives - Web resource - mediaexhibitions.no  Additional Support:   Sometimes it can be challenging to manage all aspects of daily life. Finding the  right support can help you maintain or improve your health and independence. Please let me know if you would like to talk further about finding resources to assist you.

## 2024-01-21 NOTE — Assessment & Plan Note (Signed)
 Euthyroid clinically  Continue levothyroxine 75 mcg daily

## 2024-01-21 NOTE — Telephone Encounter (Signed)
 Resting labs are ordered for next year

## 2024-01-21 NOTE — Progress Notes (Signed)
 [facility]  Medicare Wellness Visit               Jason Trujillo is a 78 y.o. male who presents today for the following Medicare Wellness Visit: Subsequent       Health Risk Assessment   During the past month, how would you rate your general health?:  (Patient-Rptd) (P) Very Good  Which of the following tasks can you do without assistance - drive or take the bus alone; shop for groceries or clothes; prepare your own meals; do your own housework/laundry; handle your own finances/pay bills; eat, bathe or get around your home?: (Patient-Rptd) (P) Drive or take the bus alone, Shop for groceries or clothes, Do your own housework/laundry, Handle your own finances/pay bills, Eat, bathe, dress or get around your home  Which of the following problems have you been bothered by in the past month - dizzy when standing up; problems using the phone; feeling tired or fatigued; moderate or severe body pain?: (Patient-Rptd) (P) None of these  Do you exercise for about 20 minutes 3 or more days per week?:(Patient-Rptd) (P) Yes  During the past month was someone available to help if you needed and wanted help?  For example, if you felt nervous, lonely, got sick and had to stay in bed, needed someone to talk to, needed help with daily chores or needed help just taking care of yourself.: (Patient-Rptd) (P) Yes  Do you always wear a seat belt?: (Patient-Rptd) (P) Yes  Do you have any trouble taking medications the way you have been told to take them?: (Patient-Rptd) (P) No  Have you been given any information that can help you with keeping track of your medications?: (Patient-Rptd) (P) Yes  Do you have trouble paying for your medications?: (Patient-Rptd) (P) No  Hospitalizations   Hospitalization within past year: No    Screenings         01/17/2024 01/21/2024   Ambulatory Screenings   Falls Risk: Terrilee more than 2 times in past year N     Falls Risk: Suffer any injuries? N     Depression: PHQ2 Total Score  0    Depression: PHQ9 Total Score  0       Patient-reported        Substance Use Disorder Screen:  Andrae  reports that he has never smoked. He has never used smokeless tobacco. He reports that he does not drink alcohol and does not use drugs.            Functional Ability/Level of Safety   Falls Risk/Home Safety Assessment:  Have you been given any information that can help you with hazards in your house, such as scatter rugs, furniture, etc?: (Patient-Rptd) (P) Yes  Do you feel unsteady when standing or walking?: (Patient-Rptd) (P) No  Do you worry about falling?: (Patient-Rptd) (P) No  Have you fallen two or more times in the past year?: (Patient-Rptd) (P) No  Did you suffer any injuries from your falls in the past year?: (Patient-Rptd) (P) No    Home Safety:   Low Risk for Falls  Get Up and Go: <20 secs    Hearing Assessment:  patient reports hearing within normal limits     Visual Acuity:  Corrected Far Vision  Right Eye: 20/25  Left Eye : 20/25  Both Eyes: 20/25  If no visual acuity exam above, the patient has declined the visual acuity exam portion of this encounter.      Exercise:  Light ( i.e. stretching or slow walking )    Diet:  Diet: - consumes a well balanced diet      Activities of Daily Living   ADL's  Bathing: Independent  Dressing: Independent  Mobility: Independent  Transfer: Independent  Eating: Independent  Toileting: Independent    IADL's  Phone: Independent  Housekeeping: Independent  Laundry: Independent  Transportation: Independent  Medications: Independent  Finances: Independent    ADL assistance:   No assistance needed    Social Activities/Engagement   Frequency of Communication with Friends and Family:  often    Frequency of Social Gatherings with Friends and Family:   often    Advanced Care Planning   Discussion of Advance Directives:   Has no Scientist, Water Quality. Form provided.         Exam   BP 120/70 (BP Site: Right arm, Patient Position: Sitting)   Pulse (!) 58   Temp 97.7 F (36.5  C) (Temporal)   Ht 1.61 m (5' 3.39)   Wt 59.6 kg (131 lb 6.4 oz)   PF 98 L/min   BMI 22.99 kg/m   Physical Exam  Constitutional:       Appearance: Normal appearance.   Cardiovascular:      Rate and Rhythm: Normal rate and regular rhythm.      Pulses: Normal pulses.      Heart sounds: Normal heart sounds.   Pulmonary:      Effort: Pulmonary effort is normal.      Breath sounds: Normal breath sounds.   Musculoskeletal:      Cervical back: Normal range of motion and neck supple.   Neurological:      General: No focal deficit present.      Mental Status: He is alert and oriented to person, place, and time.           Evaluation of Cognitive Function   Mood/affect: Appropriate  Appearance:  alert, well appearing, and in no distress  Family member/caregiver input: Present- no concerns.    Mini-Cog Score:  3 recalled words - negative screen for dementia      Assessment/Plan     Assessment & Plan  Medicare annual wellness visit, subsequent         Need for COVID-19 vaccine  Given to  Orders:    COVID-19 mRNA 2025-2026 vaccine 12 yrs and above (MODERNA/SPIKEVAX) 50 mcg/0.5 mL    Screening for prostate cancer  PSA is normal.       Mixed hyperlipidemia  Lipid panel within acceptable range.  Continue atorvastatin  20 mg daily       Acquired hypothyroidism  Euthyroid clinically.  Continue levothyroxine  75 mcg daily       Essential hypertension, benign  Continue losartan  50 mg daily.       Type 2 diabetes mellitus with diabetic polyneuropathy, without long-term current use of insulin (CMS/HCC)  A1c is 6.9.  Blood sugar has fluctuated at home and frequently goes down to 60 in the morning.  Reduce Jardiance to 5 mg daily.  Continue other medications.       Stable proliferative diabetic retinopathy of left eye associated with type 2 diabetes mellitus (CMS/HCC)  Followed up by the retinal specialist.       Microalbuminuria  Borderline elevation in microalbumin.  Continue losartan  and drink more fluids.  Avoid NSAIDs              Personalized Prevention Plan   The patient was provided with a personalized prevention  plan via   the Patient Instructions tab of this visit     History/Care Team   Patient Care Team:  Tobie Eleanore MATSU, MD as PCP - General (Internal Medicine)  Medical, surgical, family history reviewed and updated during this encounter  Medication list updated and reconciled during this encounter    Additional Documentation         Verbal consent obtained to record this visit when ambient technology is utilized.

## 2024-01-28 ENCOUNTER — Other Ambulatory Visit (INDEPENDENT_AMBULATORY_CARE_PROVIDER_SITE_OTHER): Payer: Self-pay | Admitting: Internal Medicine

## 2024-01-28 DIAGNOSIS — E113552 Type 2 diabetes mellitus with stable proliferative diabetic retinopathy, left eye: Secondary | ICD-10-CM

## 2024-04-09 ENCOUNTER — Other Ambulatory Visit (INDEPENDENT_AMBULATORY_CARE_PROVIDER_SITE_OTHER): Payer: Self-pay | Admitting: Internal Medicine

## 2024-04-09 DIAGNOSIS — E039 Hypothyroidism, unspecified: Secondary | ICD-10-CM

## 2024-04-09 NOTE — Telephone Encounter (Signed)
 LOV 01/21/2024  (MAW)    Please advise-  Thanks!

## 2024-05-22 ENCOUNTER — Ambulatory Visit (INDEPENDENT_AMBULATORY_CARE_PROVIDER_SITE_OTHER): Admitting: Internal Medicine
